# Patient Record
Sex: Male | Born: 1975 | Hispanic: Yes | Marital: Single | Smoking: Former smoker
Health system: Southern US, Community
[De-identification: ages and names within clinical notes are randomized; demographics above are authoritative.]

## PROBLEM LIST (undated history)

## (undated) DIAGNOSIS — E119 Type 2 diabetes mellitus without complications: Secondary | ICD-10-CM

## (undated) DIAGNOSIS — I10 Essential (primary) hypertension: Secondary | ICD-10-CM

## (undated) DIAGNOSIS — Z91199 Patient's noncompliance with other medical treatment and regimen due to unspecified reason: Secondary | ICD-10-CM

## (undated) DIAGNOSIS — R079 Chest pain, unspecified: Secondary | ICD-10-CM

---

## 2009-10-27 ENCOUNTER — Emergency Department: Payer: Self-pay | Admitting: Emergency Medicine

## 2012-08-25 ENCOUNTER — Emergency Department: Payer: Self-pay | Admitting: Emergency Medicine

## 2012-08-27 ENCOUNTER — Emergency Department: Payer: Self-pay | Admitting: Emergency Medicine

## 2012-08-29 ENCOUNTER — Emergency Department: Payer: Self-pay | Admitting: Emergency Medicine

## 2012-08-31 ENCOUNTER — Emergency Department: Payer: Self-pay | Admitting: Emergency Medicine

## 2012-10-15 ENCOUNTER — Emergency Department: Payer: Self-pay | Admitting: Emergency Medicine

## 2013-02-24 ENCOUNTER — Emergency Department: Payer: Self-pay | Admitting: Emergency Medicine

## 2020-03-30 ENCOUNTER — Encounter: Payer: Self-pay | Admitting: Emergency Medicine

## 2020-03-30 ENCOUNTER — Emergency Department
Admission: EM | Admit: 2020-03-30 | Discharge: 2020-03-30 | Disposition: A | Discharge status: Home / Self Care | Payer: Self-pay | Attending: Emergency Medicine | Admitting: Emergency Medicine

## 2020-03-30 ENCOUNTER — Emergency Department: Payer: Self-pay

## 2020-03-30 ENCOUNTER — Other Ambulatory Visit: Payer: Self-pay

## 2020-03-30 DIAGNOSIS — R29810 Facial weakness: Secondary | ICD-10-CM | POA: Insufficient documentation

## 2020-03-30 DIAGNOSIS — R200 Anesthesia of skin: Secondary | ICD-10-CM

## 2020-03-30 DIAGNOSIS — E119 Type 2 diabetes mellitus without complications: Secondary | ICD-10-CM | POA: Insufficient documentation

## 2020-03-30 DIAGNOSIS — R202 Paresthesia of skin: Secondary | ICD-10-CM | POA: Insufficient documentation

## 2020-03-30 HISTORY — DX: Type 2 diabetes mellitus without complications: E11.9

## 2020-03-30 LAB — COMPREHENSIVE METABOLIC PANEL
ALT: 11 U/L (ref 0–44)
AST: 18 U/L (ref 15–41)
Albumin: 4.4 g/dL (ref 3.5–5.0)
Alkaline Phosphatase: 118 U/L (ref 38–126)
Anion gap: 11 (ref 5–15)
BUN: 17 mg/dL (ref 6–20)
CO2: 28 mmol/L (ref 22–32)
Calcium: 9.1 mg/dL (ref 8.9–10.3)
Chloride: 97 mmol/L — ABNORMAL LOW (ref 98–111)
Creatinine, Ser: 0.91 mg/dL (ref 0.61–1.24)
GFR calc Af Amer: >60 mL/min (ref >60)
GFR calc non Af Amer: >60 mL/min (ref >60)
Glucose, Bld: 324 mg/dL — ABNORMAL HIGH (ref 70–99)
Potassium: 3.1 mmol/L — ABNORMAL LOW (ref 3.5–5.1)
Sodium: 136 mmol/L (ref 135–145)
Total Bilirubin: 1 mg/dL (ref 0.3–1.2)
Total Protein: 7.7 g/dL (ref 6.5–8.1)

## 2020-03-30 LAB — CBC WITH DIFFERENTIAL/PLATELET
Abs Immature Granulocytes: 0.01 10*3/uL (ref 0.00–0.07)
Basophils Absolute: 0 10*3/uL (ref 0.0–0.1)
Basophils Relative: 1 %
Eosinophils Absolute: 0.1 10*3/uL (ref 0.0–0.5)
Eosinophils Relative: 2 %
HCT: 42.8 % (ref 39.0–52.0)
Hemoglobin: 14.8 g/dL (ref 13.0–17.0)
Immature Granulocytes: 0 %
Lymphocytes Relative: 39 %
Lymphs Abs: 2.5 10*3/uL (ref 0.7–4.0)
MCH: 27.3 pg (ref 26.0–34.0)
MCHC: 34.6 g/dL (ref 30.0–36.0)
MCV: 78.8 fL — ABNORMAL LOW (ref 80.0–100.0)
Monocytes Absolute: 0.5 10*3/uL (ref 0.1–1.0)
Monocytes Relative: 8 %
Neutro Abs: 3.3 10*3/uL (ref 1.7–7.7)
Neutrophils Relative %: 50 %
Platelets: 238 10*3/uL (ref 150–400)
RBC: 5.43 MIL/uL (ref 4.22–5.81)
RDW: 12.6 % (ref 11.5–15.5)
WBC: 6.5 10*3/uL (ref 4.0–10.5)
nRBC: 0 % (ref 0.0–0.2)

## 2020-03-30 LAB — GLUCOSE, CAPILLARY: Glucose-Capillary: 312 mg/dL — ABNORMAL HIGH (ref 70–99)

## 2020-03-30 LAB — TROPONIN I (HIGH SENSITIVITY): Troponin I (High Sensitivity): 10 ng/L (ref <18)

## 2020-03-30 NOTE — ED Triage Notes (Signed)
Patient ambulatory to triage with steady gait, without difficulty or distress noted; pt reports went to bed at midnight; awoke hr PTA with "numbness" to left arm and left side of head; denies pain, denies accomp symptoms, denies hx of same; denies weakness; pt A&Ox3; PERRL, MAEW

## 2020-03-30 NOTE — ED Provider Notes (Signed)
[facility] Emergency Department Provider Note   ____________________________________________   First MD Initiated Contact with Patient 03/30/20 [phone]     (approximate)  I have reviewed the triage vital signs and the nursing notes.   HISTORY  Chief Complaint Numbness    HPI Gary Bowen is a 44 y.o. male with a past medical history of type 2 diabetes who presents for an episode of left upper extremity and left facial numbness that began approximately 3 hours prior to arrival and was associated with some left jaw pain.  Patient states that he awoke this morning and when he went to use the bathroom began feeling this numbness of his left arm as well the facial symptoms.  Patient states that the symptoms resolved over the next 30 minutes by the time he got to the emergency department.  Patient does endorse associated left-sided neck pain.  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  Patient denies any vision changes, tinnitus, weakness, dizziness, chest pain, abdominal pain, or nausea/vomiting         Past Medical History:  Diagnosis Date  . Diabetes mellitus without complication (HCC)     There are no problems to display for this patient.   History reviewed. No pertinent surgical history.  Prior to Admission medications   Not on File    Allergies Patient has no known allergies.  No family history on file.  Social History Social History   Tobacco Use  . Smoking status: Never Smoker  . Smokeless tobacco: Never Used  Vaping Use  . Vaping Use: Never used  Substance Use Topics  . Alcohol use: Not on file  . Drug use: Not on file    Review of Systems Constitutional: No fever/chills Eyes: No visual changes. ENT: No sore throat. Cardiovascular: Denies chest pain. Respiratory: Denies shortness of breath. Gastrointestinal: No abdominal pain.  No nausea, no  vomiting.  No diarrhea. Genitourinary: Negative for dysuria. Musculoskeletal: Negative for acute arthralgias Skin: Negative for rash. Neurological: Negative for headaches, endorses numbness in left upper extremity Psychiatric: Negative for suicidal ideation/homicidal ideation   ____________________________________________   PHYSICAL EXAM:  VITAL SIGNS: ED Triage Vitals  Enc Vitals Group     BP 03/30/20 0435 (!) 185/108     Pulse Rate 03/30/20 0435 (!) 103     Resp 03/30/20 0435 20     Temp 03/30/20 0435 98.4 F (36.9 C)     Temp Source 03/30/20 0435 Oral     SpO2 03/30/20 0435 100 %     Weight 03/30/20 0421 280 lb (127 kg)     Height 03/30/20 0421 5\' 9"  (1.753 m)     Head Circumference --      Peak Flow --      Pain Score 03/30/20 0421 0     Pain Loc --      Pain Edu? --      Excl. in GC? --    Constitutional: Alert and oriented. Well appearing and in no acute distress. Eyes: Conjunctivae are normal. PERRL. Head: Atraumatic. Nose: No congestion/rhinnorhea. Mouth/Throat: Mucous membranes are moist. Neck: No stridor Cardiovascular: Grossly normal heart sounds.  Good peripheral circulation. Respiratory: Normal respiratory effort.  No retractions. Gastrointestinal: Soft and nontender. No distention. Musculoskeletal: No obvious deformities Neurologic:  Normal speech and language. No gross focal neurologic deficits are appreciated. Skin:  Skin is warm and dry. No rash noted. Psychiatric: Mood and  affect are normal. Speech and behavior are normal.  ____________________________________________   LABS (all labs ordered are listed, but only abnormal results are displayed)  Labs Reviewed  CBC WITH DIFFERENTIAL/PLATELET - Abnormal; Notable for the following components:      Result Value   MCV 78.8 (*)    All other components within normal limits  COMPREHENSIVE METABOLIC PANEL - Abnormal; Notable for the following components:   Potassium 3.1 (*)    Chloride 97 (*)     Glucose, Bld 324 (*)    All other components within normal limits  GLUCOSE, CAPILLARY - Abnormal; Notable for the following components:   Glucose-Capillary 312 (*)    All other components within normal limits  CBG MONITORING, ED  TROPONIN I (HIGH SENSITIVITY)   ____________________________________________  EKG  ED ECG REPORT I, Merwyn Katos, the attending physician, personally viewed and interpreted this ECG.  Date: 03/30/2020 EKG Time: 0424 Rate: 103 Rhythm: Tachycardic sinus rhythm QRS Axis: normal Intervals: normal ST/T Wave abnormalities: normal Narrative Interpretation: no evidence of acute ischemia  ____________________________________________  RADIOLOGY  ED MD interpretation: CT without contrast of the head shows no evidence of intracerebral hemorrhage, skull fracture, or obvious masses  Official radiology report(s): CT Head Wo Contrast  Result Date: 03/30/2020 CLINICAL DATA:  Left arm numbness or tingling, paresthesia EXAM: CT HEAD WITHOUT CONTRAST TECHNIQUE: Contiguous axial images were obtained from the base of the skull through the vertex without intravenous contrast. COMPARISON:  None. FINDINGS: Brain: No evidence of acute infarction, hemorrhage, hydrocephalus, or mass lesion/mass effect. A tiny arachnoid cysts could be present at the left vertex, incidental. No evidence of white matter disease. Vascular: No hyperdense vessel or unexpected calcification. Skull: Normal. Negative for fracture or focal lesion. Sinuses/Orbits: No acute finding. IMPRESSION: Negative study, no explanation symptoms. Electronically Signed   By: Marnee Spring M.D.   On: 03/30/2020 05:17    ____________________________________________   PROCEDURES  Procedure(s) performed (including Critical Care):  Procedures   ____________________________________________   INITIAL IMPRESSION / ASSESSMENT AND PLAN / ED COURSE  As part of my medical decision making, I reviewed the following  data within the electronic MEDICAL RECORD NUMBER Nursing notes reviewed and incorporated, Labs reviewed, EKG interpreted, Old chart reviewed, Radiograph reviewed and Notes from prior ED visits reviewed and incorporated      Presents with sensation of tingling to left upper extremity and left face.  Patients symptoms and work-up not consistent with a stroke and therefore they will be discharged from the ED.  Patient has currently been stabilized in the emergency department.  Patient received an head CT that was negative for stroke.  Patients symptoms not typical for other emergent causes such as dissection, infection, DKA, trauma. Patient will be discharged with strict return precautions and follow up with primary MD within 24 hours for further evaluation.       ____________________________________________   FINAL CLINICAL IMPRESSION(S) / ED DIAGNOSES  Final diagnoses:  Paresthesia  Numbness  Left upper extremity numbness  Left facial numbness     ED Discharge Orders    None       Note:  This document was prepared using Dragon voice recognition software and may include unintentional dictation errors.   Merwyn Katos, MD 03/30/20 [phone]

## 2020-03-30 NOTE — Discharge Instructions (Addendum)
Por favor, [place] ibuprofin o tylenol por inflammacion o dolor

## 2020-03-30 NOTE — ED Notes (Signed)
EDP at bedside  

## 2021-02-18 IMAGING — CT CT HEAD W/O CM
3 series · 15 of 47 positions shown, 18 images · non-contrast
Comparison: None.

CLINICAL DATA: Left arm numbness or tingling, paresthesia

EXAM:
CT HEAD WITHOUT CONTRAST
TECHNIQUE: Contiguous axial images were obtained from the base of the skull
through the vertex without intravenous contrast.

[Series 2: head wo · axial · 0.43mm/px · z∈[-115,+10]mm · 9 of 30 slices shown, 12 images]
[im 3/30  brain]
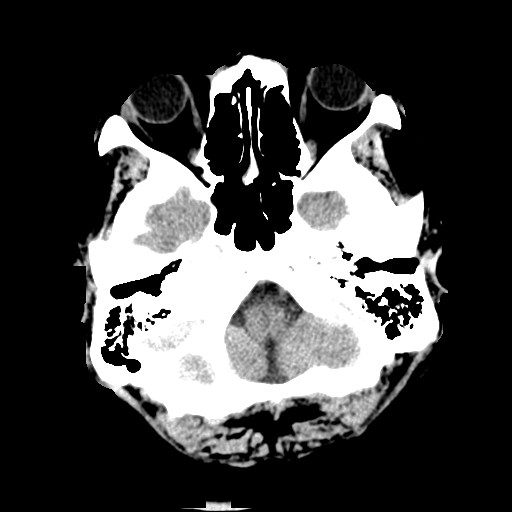
[im 3/30  bone]
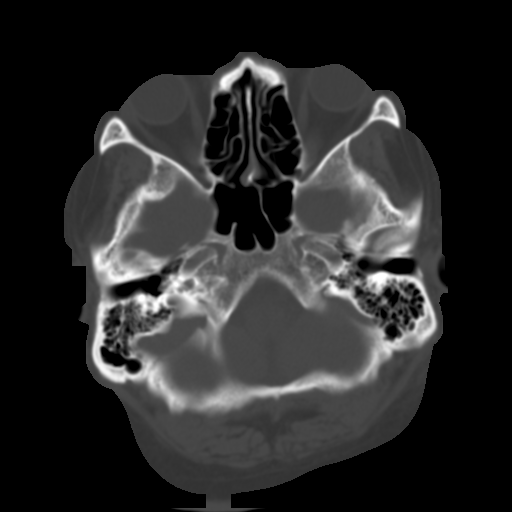
[im 6/30  brain]
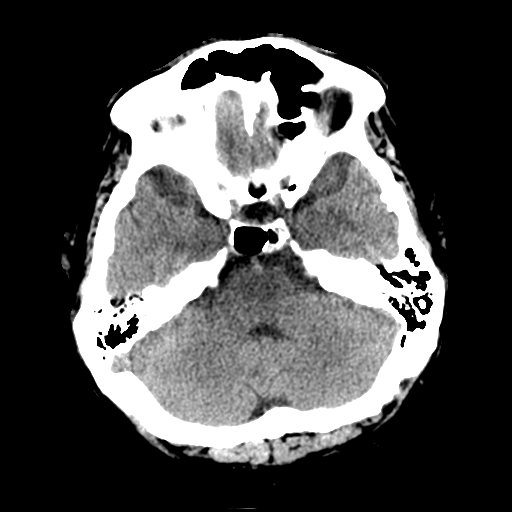
[im 9/30  brain]
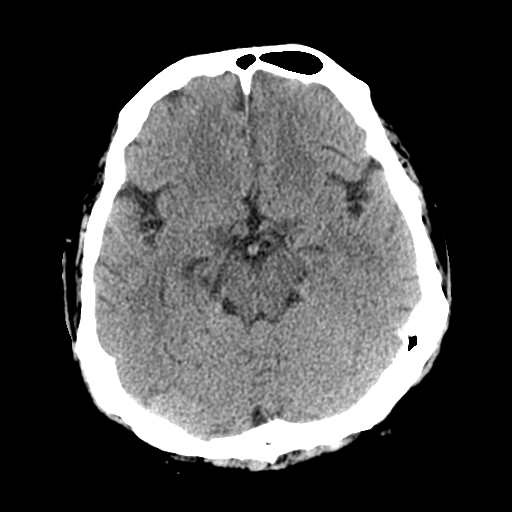
[im 12/30  brain]
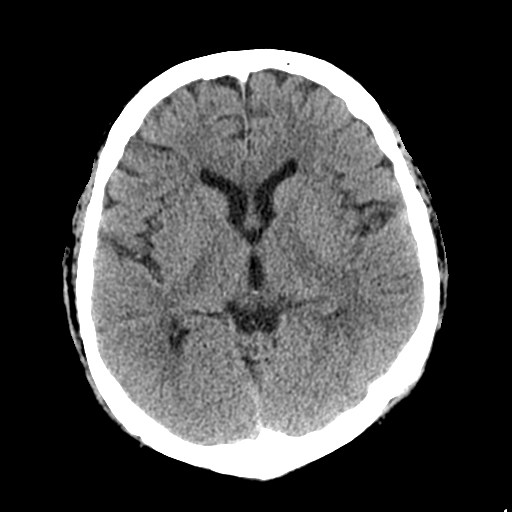
[im 16/30  brain]
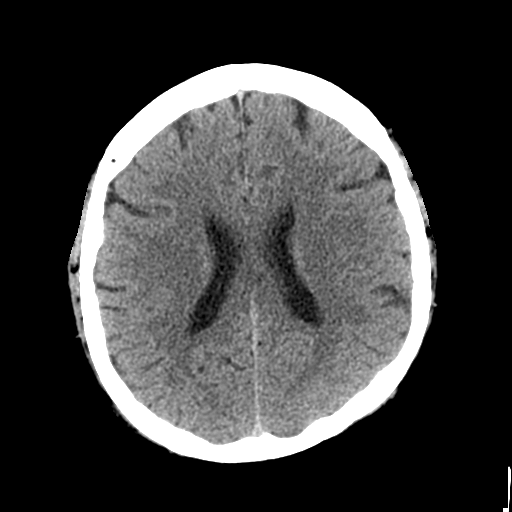
[im 16/30  bone]
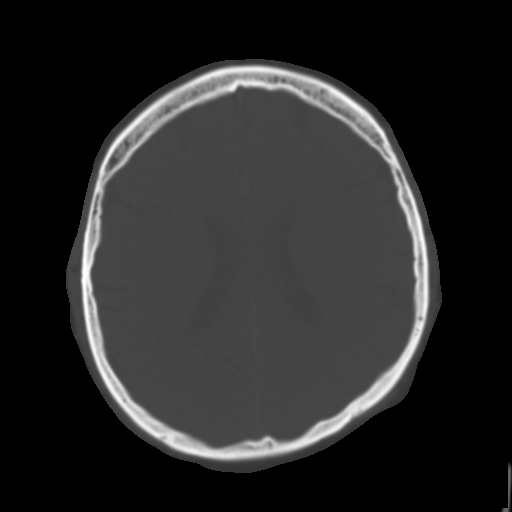
[im 19/30  brain]
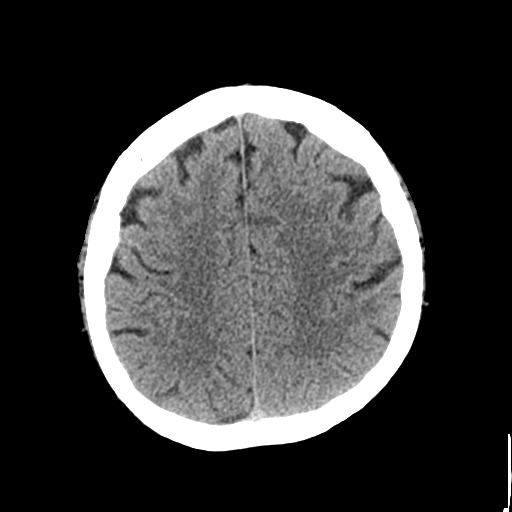
[im 22/30  brain]
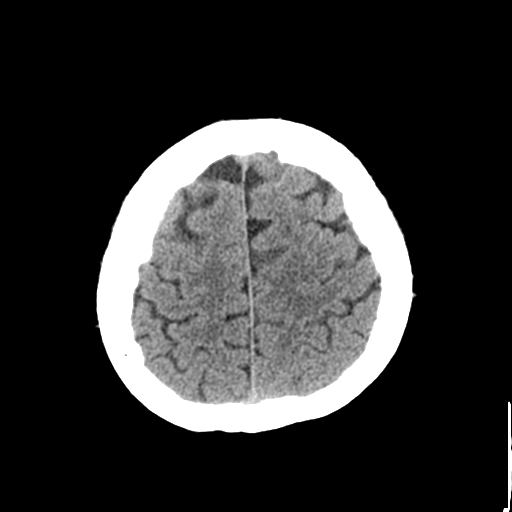
[im 25/30  brain]
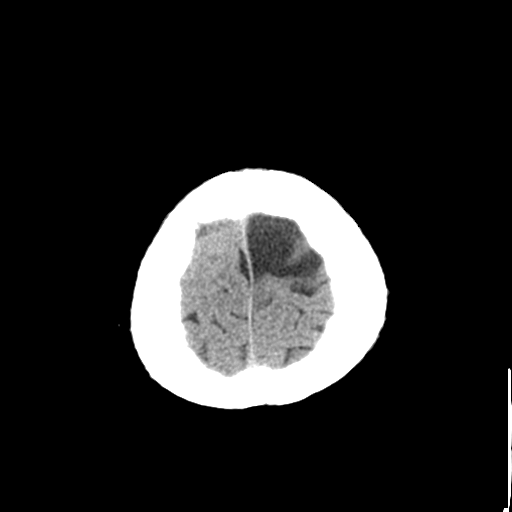
[im 28/30  brain]
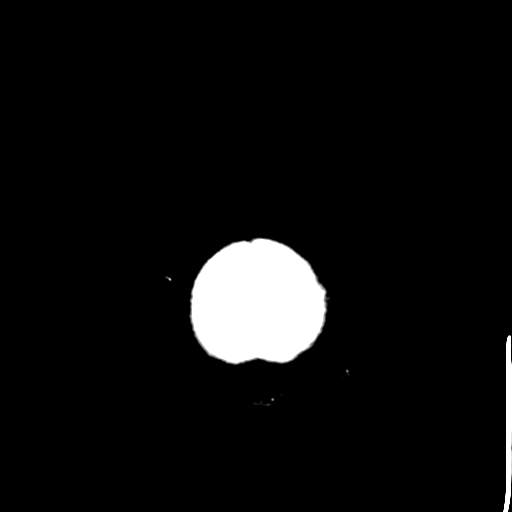
[im 28/30  bone]
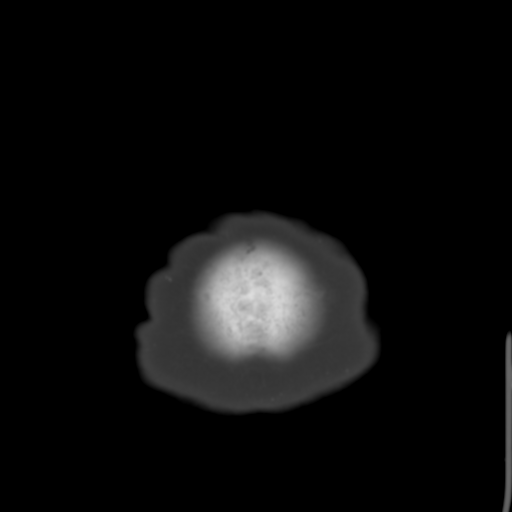

[Series 4: coronal soft tissue · coronal · 0.31mm/px · 3 of 63 slices shown]
[im 21/63  brain]
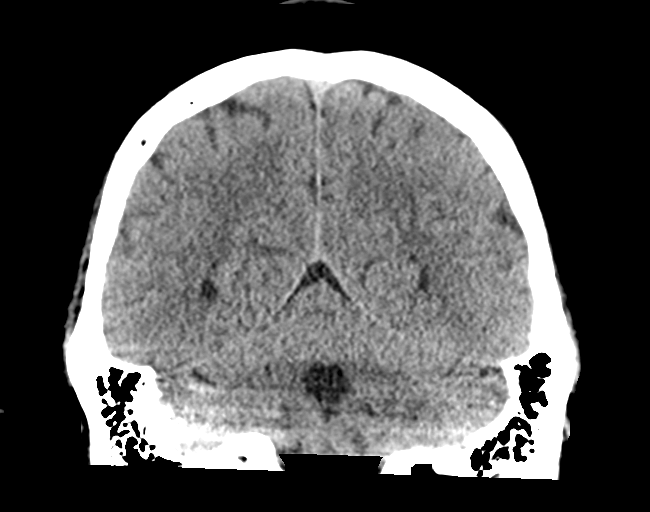
[im 28/63  brain]
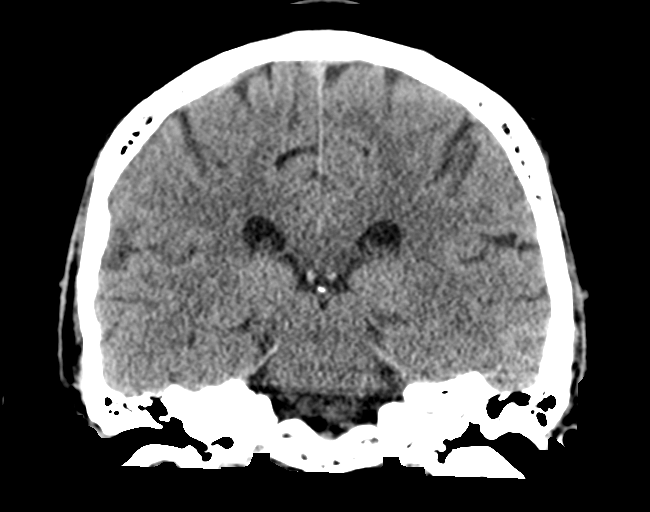
[im 35/63  brain]
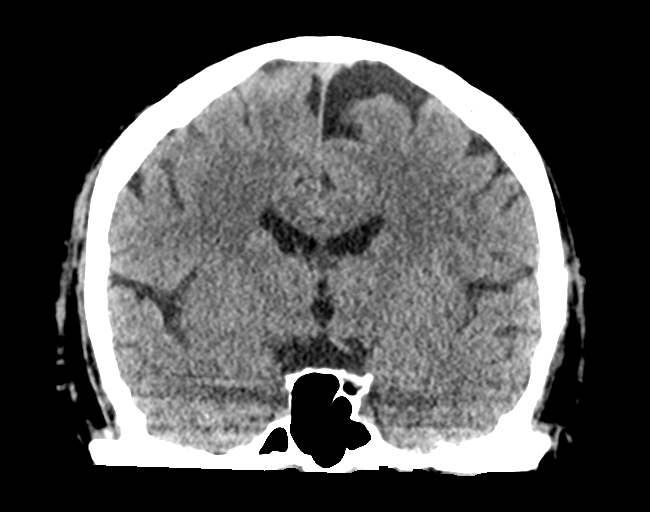

[Series 5: sagittal soft tissue · sagittal · 0.31mm/px · 3 of 60 slices shown]
[im 20/60  brain]
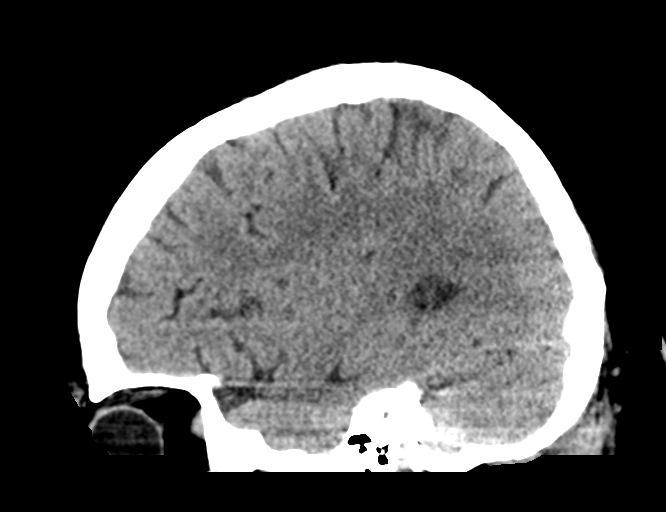
[im 30/60  brain]
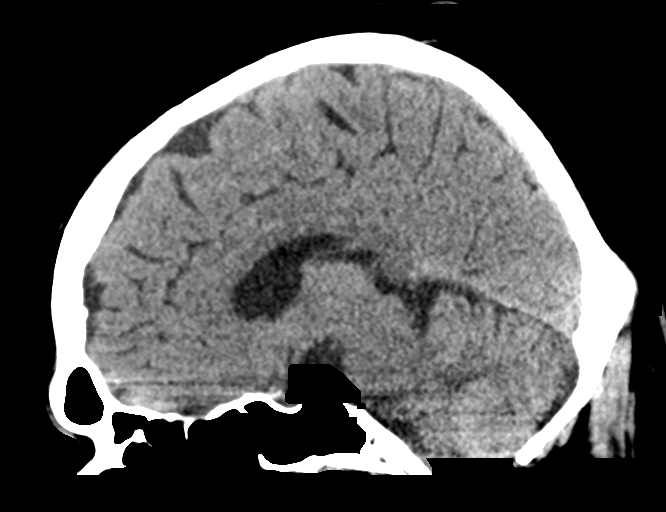
[im 40/60  brain]
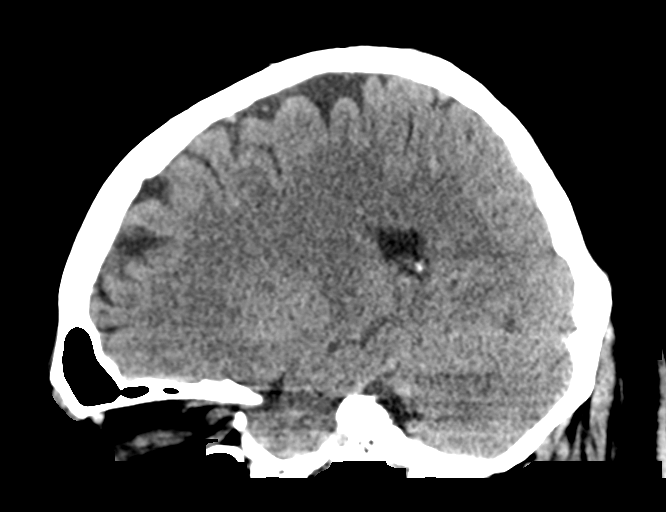

[15 of 47 positions shown; findings below may reference images not displayed]

FINDINGS: Brain: No evidence of acute infarction, hemorrhage, hydrocephalus,
or mass lesion/mass effect. A tiny arachnoid cysts could be present
at the left vertex, incidental. No evidence of white matter disease.

Vascular: No hyperdense vessel or unexpected calcification.

Skull: Normal. Negative for fracture or focal lesion.

Sinuses/Orbits: No acute finding.
IMPRESSION: Negative study, no explanation symptoms.

## 2022-11-20 ENCOUNTER — Other Ambulatory Visit: Payer: Self-pay

## 2022-11-20 ENCOUNTER — Inpatient Hospital Stay (HOSPITAL_COMMUNITY)
Admit: 2022-11-20 | Discharge: 2022-11-20 | Disposition: A | Discharge status: Home / Self Care | Payer: Self-pay | Attending: Nurse Practitioner | Admitting: Nurse Practitioner

## 2022-11-20 ENCOUNTER — Inpatient Hospital Stay
Admission: EM | Admit: 2022-11-20 | Discharge: 2022-11-21 | DRG: 281 | Disposition: A | Discharge status: Other Acute Inpatient Hospital | Payer: Self-pay | Attending: Hospitalist | Admitting: Hospitalist

## 2022-11-20 ENCOUNTER — Emergency Department: Payer: Self-pay

## 2022-11-20 DIAGNOSIS — I10 Essential (primary) hypertension: Secondary | ICD-10-CM | POA: Diagnosis present

## 2022-11-20 DIAGNOSIS — Z87891 Personal history of nicotine dependence: Secondary | ICD-10-CM

## 2022-11-20 DIAGNOSIS — I214 Non-ST elevation (NSTEMI) myocardial infarction: Principal | ICD-10-CM | POA: Diagnosis present

## 2022-11-20 DIAGNOSIS — R079 Chest pain, unspecified: Secondary | ICD-10-CM

## 2022-11-20 DIAGNOSIS — Z79899 Other long term (current) drug therapy: Secondary | ICD-10-CM

## 2022-11-20 DIAGNOSIS — E785 Hyperlipidemia, unspecified: Secondary | ICD-10-CM | POA: Diagnosis present

## 2022-11-20 DIAGNOSIS — I2510 Atherosclerotic heart disease of native coronary artery without angina pectoris: Secondary | ICD-10-CM | POA: Diagnosis present

## 2022-11-20 DIAGNOSIS — I161 Hypertensive emergency: Secondary | ICD-10-CM | POA: Diagnosis present

## 2022-11-20 DIAGNOSIS — E876 Hypokalemia: Secondary | ICD-10-CM | POA: Diagnosis present

## 2022-11-20 DIAGNOSIS — Z8249 Family history of ischemic heart disease and other diseases of the circulatory system: Secondary | ICD-10-CM

## 2022-11-20 DIAGNOSIS — E1165 Type 2 diabetes mellitus with hyperglycemia: Secondary | ICD-10-CM

## 2022-11-20 HISTORY — DX: Morbid (severe) obesity due to excess calories: E66.01

## 2022-11-20 HISTORY — DX: Patient's noncompliance with other medical treatment and regimen due to unspecified reason: Z91.199

## 2022-11-20 HISTORY — DX: Chest pain, unspecified: R07.9

## 2022-11-20 HISTORY — DX: Essential (primary) hypertension: I10

## 2022-11-20 LAB — CBG MONITORING, ED
Glucose-Capillary: 382 mg/dL — ABNORMAL HIGH (ref 70–99)
Glucose-Capillary: 321 mg/dL — ABNORMAL HIGH (ref 70–99)
Glucose-Capillary: 189 mg/dL — ABNORMAL HIGH (ref 70–99)
Glucose-Capillary: 194 mg/dL — ABNORMAL HIGH (ref 70–99)

## 2022-11-20 LAB — BASIC METABOLIC PANEL
Anion gap: 9 (ref 5–15)
BUN: 18 mg/dL (ref 6–20)
CO2: 28 mmol/L (ref 22–32)
Calcium: 9 mg/dL (ref 8.9–10.3)
Chloride: 97 mmol/L — ABNORMAL LOW (ref 98–111)
Creatinine, Ser: 1.06 mg/dL (ref 0.61–1.24)
GFR, Estimated: >60 mL/min (ref >60)
Glucose, Bld: 366 mg/dL — ABNORMAL HIGH (ref 70–99)
Potassium: 3.1 mmol/L — ABNORMAL LOW (ref 3.5–5.1)
Sodium: 134 mmol/L — ABNORMAL LOW (ref 135–145)

## 2022-11-20 LAB — CBC
HCT: 45.7 % (ref 39.0–52.0)
Hemoglobin: 15.6 g/dL (ref 13.0–17.0)
MCH: 27.6 pg (ref 26.0–34.0)
MCHC: 34.1 g/dL (ref 30.0–36.0)
MCV: 80.9 fL (ref 80.0–100.0)
Platelets: 290 10*3/uL (ref 150–400)
RBC: 5.65 MIL/uL (ref 4.22–5.81)
RDW: 12 % (ref 11.5–15.5)
WBC: 8.2 10*3/uL (ref 4.0–10.5)
nRBC: 0 % (ref 0.0–0.2)

## 2022-11-20 LAB — GLUCOSE, CAPILLARY: Glucose-Capillary: 158 mg/dL — ABNORMAL HIGH (ref 70–99)

## 2022-11-20 LAB — ECHOCARDIOGRAM COMPLETE
AR max vel: 3.69 cm2
AV Peak grad: 2.7 mmHg
Ao pk vel: 0.82 m/s
Area-P 1/2: 3.08 cm2
Height: 69 in
S' Lateral: 2.8 cm
Weight: 4480 oz

## 2022-11-20 LAB — HEPARIN LEVEL (UNFRACTIONATED)
Heparin Unfractionated: >1.1 IU/mL — ABNORMAL HIGH (ref 0.30–0.70)
Heparin Unfractionated: 0.12 IU/mL — ABNORMAL LOW (ref 0.30–0.70)

## 2022-11-20 LAB — TROPONIN I (HIGH SENSITIVITY)
Troponin I (High Sensitivity): 208 ng/L (ref <18)
Troponin I (High Sensitivity): 1836 ng/L (ref <18)

## 2022-11-20 LAB — HIV ANTIBODY (ROUTINE TESTING W REFLEX): HIV Screen 4th Generation wRfx: NONREACTIVE

## 2022-11-20 LAB — PROTIME-INR
INR: 1.1 (ref 0.8–1.2)
Prothrombin Time: 14.5 seconds (ref 11.4–15.2)

## 2022-11-20 LAB — APTT: aPTT: 37 seconds — ABNORMAL HIGH (ref 24–36)

## 2022-11-20 MED ORDER — HEPARIN BOLUS VIA INFUSION
3000.0000 [IU] | Freq: Once | INTRAVENOUS | Status: AC
  Administered 2022-11-20: 3000 [IU] via INTRAVENOUS
  Filled 2022-11-20: qty 3000

## 2022-11-20 MED ORDER — CARVEDILOL 3.125 MG PO TABS
3.1250 mg | ORAL_TABLET | Freq: Two times a day (BID) | ORAL | Status: DC
  Administered 2022-11-20 (×2): 3.125 mg via ORAL
  Filled 2022-11-20 (×2): qty 1

## 2022-11-20 MED ORDER — ATORVASTATIN CALCIUM 80 MG PO TABS
80.0000 mg | ORAL_TABLET | Freq: Every day | ORAL | Status: DC
  Administered 2022-11-20 – 2022-11-21 (×2): 80 mg via ORAL
  Filled 2022-11-20: qty 1
  Filled 2022-11-20: qty 4

## 2022-11-20 MED ORDER — SODIUM CHLORIDE 0.9 % WEIGHT BASED INFUSION: 1.0000 mL/kg/h | INTRAVENOUS | Status: DC

## 2022-11-20 MED ORDER — NITROGLYCERIN IN D5W 200-5 MCG/ML-% IV SOLN
0.0000 ug/min | INTRAVENOUS | Status: DC
  Administered 2022-11-20: 5 ug/min via INTRAVENOUS
  Filled 2022-11-20: qty 250

## 2022-11-20 MED ORDER — HEPARIN (PORCINE) 25000 UT/250ML-% IV SOLN
1600.0000 [IU]/h | INTRAVENOUS | Status: DC
  Administered 2022-11-20: 1200 [IU]/h via INTRAVENOUS
  Administered 2022-11-20: 1600 [IU]/h via INTRAVENOUS
  Filled 2022-11-20: qty 250

## 2022-11-20 MED ORDER — METOPROLOL TARTRATE 5 MG/5ML IV SOLN: 5.0000 mg | Freq: Four times a day (QID) | INTRAVENOUS | Status: DC | PRN

## 2022-11-20 MED ORDER — ALPRAZOLAM 0.25 MG PO TABS: 0.2500 mg | ORAL_TABLET | Freq: Two times a day (BID) | ORAL | Status: DC | PRN

## 2022-11-20 MED ORDER — POTASSIUM CHLORIDE CRYS ER 20 MEQ PO TBCR
40.0000 meq | EXTENDED_RELEASE_TABLET | Freq: Once | ORAL | Status: AC
  Administered 2022-11-20: 40 meq via ORAL
  Filled 2022-11-20: qty 2

## 2022-11-20 MED ORDER — ASPIRIN 81 MG PO CHEW
324.0000 mg | CHEWABLE_TABLET | Freq: Once | ORAL | Status: AC
  Administered 2022-11-20: 324 mg via ORAL
  Filled 2022-11-20: qty 4

## 2022-11-20 MED ORDER — SODIUM CHLORIDE 0.9% FLUSH: 3.0000 mL | INTRAVENOUS | Status: DC | PRN

## 2022-11-20 MED ORDER — INSULIN ASPART 100 UNIT/ML IJ SOLN
0.0000 [IU] | INTRAMUSCULAR | Status: DC
  Administered 2022-11-20: 4 [IU] via SUBCUTANEOUS
  Administered 2022-11-20: 20 [IU] via SUBCUTANEOUS
  Administered 2022-11-20: 15 [IU] via SUBCUTANEOUS
  Administered 2022-11-20 (×2): 4 [IU] via SUBCUTANEOUS
  Administered 2022-11-21: 7 [IU] via SUBCUTANEOUS
  Administered 2022-11-21: 4 [IU] via SUBCUTANEOUS
  Filled 2022-11-20 (×7): qty 1

## 2022-11-20 MED ORDER — NITROGLYCERIN 2 % TD OINT: 1.0000 [in_us] | TOPICAL_OINTMENT | Freq: Four times a day (QID) | TRANSDERMAL | Status: DC

## 2022-11-20 MED ORDER — HEPARIN BOLUS VIA INFUSION
4000.0000 [IU] | Freq: Once | INTRAVENOUS | Status: AC
  Administered 2022-11-20: 4000 [IU] via INTRAVENOUS
  Filled 2022-11-20: qty 4000

## 2022-11-20 MED ORDER — ASPIRIN 81 MG PO CHEW
81.0000 mg | CHEWABLE_TABLET | ORAL | Status: AC
  Administered 2022-11-21: 81 mg via ORAL
  Filled 2022-11-20: qty 1

## 2022-11-20 MED ORDER — NITROGLYCERIN 2 % TD OINT
1.0000 [in_us] | TOPICAL_OINTMENT | Freq: Once | TRANSDERMAL | Status: AC
  Administered 2022-11-20: 1 [in_us] via TOPICAL
  Filled 2022-11-20: qty 1

## 2022-11-20 MED ORDER — LOSARTAN POTASSIUM 25 MG PO TABS
25.0000 mg | ORAL_TABLET | Freq: Every day | ORAL | Status: DC
  Administered 2022-11-20 – 2022-11-21 (×2): 25 mg via ORAL
  Filled 2022-11-20 (×2): qty 1

## 2022-11-20 MED ORDER — ACETAMINOPHEN 325 MG PO TABS: 650.0000 mg | ORAL_TABLET | ORAL | Status: DC | PRN

## 2022-11-20 MED ORDER — SODIUM CHLORIDE 0.9% FLUSH
3.0000 mL | Freq: Two times a day (BID) | INTRAVENOUS | Status: DC
  Administered 2022-11-20 (×2): 3 mL via INTRAVENOUS

## 2022-11-20 MED ORDER — ONDANSETRON HCL 4 MG/2ML IJ SOLN: 4.0000 mg | Freq: Four times a day (QID) | INTRAMUSCULAR | Status: DC | PRN

## 2022-11-20 MED ORDER — HEPARIN (PORCINE) 25000 UT/250ML-% IV SOLN
1400.0000 [IU]/h | INTRAVENOUS | Status: DC
  Administered 2022-11-20: 1400 [IU]/h via INTRAVENOUS
  Filled 2022-11-20: qty 250

## 2022-11-20 MED ORDER — ASPIRIN 81 MG PO TBEC: 81.0000 mg | DELAYED_RELEASE_TABLET | Freq: Every day | ORAL | Status: DC

## 2022-11-20 MED ORDER — NITROGLYCERIN 0.4 MG SL SUBL: 0.4000 mg | SUBLINGUAL_TABLET | SUBLINGUAL | Status: DC | PRN

## 2022-11-20 MED ORDER — ATORVASTATIN CALCIUM 20 MG PO TABS: 40.0000 mg | ORAL_TABLET | Freq: Every day | ORAL | Status: DC

## 2022-11-20 MED ORDER — SODIUM CHLORIDE 0.9 % IV SOLN: 250.0000 mL | INTRAVENOUS | Status: DC | PRN

## 2022-11-20 MED ORDER — SODIUM CHLORIDE 0.9 % WEIGHT BASED INFUSION
3.0000 mL/kg/h | INTRAVENOUS | Status: DC
  Administered 2022-11-21: 3 mL/kg/h via INTRAVENOUS

## 2022-11-20 NOTE — Progress Notes (Signed)
  Echocardiogram 2D Echocardiogram has been performed.  Gary Bowen 11/20/2022, 12:09 PM

## 2022-11-20 NOTE — Progress Notes (Signed)
ANTICOAGULATION CONSULT NOTE - Initial Consult  Pharmacy Consult for Heparin  Indication: chest pain/ACS  No Known Allergies  Patient Measurements: Height: 5\' 9"  (175.3 cm) Weight: 127 kg (280 lb) IBW/kg (Calculated) : 70.7 Heparin Dosing Weight: 100 kg  Vital Signs: Temp: 98.1 F (36.7 C) (05/27 0406) BP: 196/115 (05/27 0430) Pulse Rate: 94 (05/27 0430)  Labs: Recent Labs    11/20/22 0409  HGB 15.6  HCT 45.7  PLT 290  CREATININE 1.06  TROPONINIHS 208*    Estimated Creatinine Clearance: 112.3 mL/min (by C-G formula based on SCr of 1.06 mg/dL).   Medical History: Past Medical History:  Diagnosis Date   Diabetes mellitus without complication (HCC)    Hypertension     Medications:  (Not in a hospital admission)   Assessment: Pharmacy consulted to dose heparin in this 47 year old male admitted with ACS/NSTEMI.  No prior anticoag noted. CrCl = 112.3 ml/min   Goal of Therapy:  HL :  0.3 - 0.7 Monitor platelets by anticoagulation protocol: Yes   Plan:  Give 4000 units bolus x 1 Start heparin infusion at 1400 units/hr Check anti-Xa level in 6 hours and daily while on heparin Continue to monitor H&H and platelets  Gary Bowen D 11/20/2022,5:08 AM

## 2022-11-20 NOTE — Assessment & Plan Note (Signed)
Received oral repletion in the ED Continue to monitor 

## 2022-11-20 NOTE — Assessment & Plan Note (Signed)
Patient is on no meds due to not following up Sliding scale coverage for now Follow A1c

## 2022-11-20 NOTE — ED Notes (Signed)
Report given to [facility] and pt moved to c-pod rm 35

## 2022-11-20 NOTE — Progress Notes (Addendum)
Spoke with Dr. Lalla Brothers via secure chat and MD gave clarification order to titrate nitro drip to keep SBP 150 or less.

## 2022-11-20 NOTE — Consult Note (Signed)
 Cardiology Consult    Patient ID: Gary Bowen MRN: [id number], DOB/AGE: 08/25/1975   Admit date: 11/20/2022 Date of Consult: 11/20/2022  Primary Physician: Patient, No Pcp Per Primary Cardiologist: Muhammad Arida, MD Requesting Provider: H. Duncan, MD  Patient Profile    Gary Bowen is a 47 y.o. male with a history of untreated HTN & DM, chest pain, and obesity, who is being seen today for the evaluation of acute coronary syndrome at the request of Dr. Duncan.  Past Medical History   Past Medical History:  Diagnosis Date   Chest pain    a. Long h/o intermittent c/p - reports prev nl w/u in [place], [place].   Diabetes mellitus without complication (HCC)    a. Dx ~ 2014 - doesn't take meds.  Random glucoses "always over 300."   Hypertension    Morbid obesity (HCC)    Noncompliance     History reviewed. No pertinent surgical history.   Allergies  No Known Allergies  History of Present Illness    47 y/o ? w/ a h/o untreated HTN & DM, intermittent chest pain, and obesity.  Patient says he was diagnosed with diabetes and hypertension about 10 years ago.  He was briefly on medications but felt that it was making him feel poorly and therefore he has not been on medicines in many years.  He reports a 7 to 8-year history of intermittent chest discomfort that occurs with either rest or with exertion, a few times a week, described as a mild left-sided discomfort without associated symptoms, lasting up to about 20 minutes, and resolving spontaneously.  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  He is unaware of any provoking or alleviating factors of this chronic pain.  He notes that he was evaluated in [place], [place] several years ago related to this pain with some type of a cardiology scan, possibly an echo, and was told that everything  was okay.  Gary Bowen was in his usual state of health until approximately 2 AM this morning, when he awoke suddenly with more severe retrosternal chest heaviness associated with dyspnea, and radiating to his left elbow.  He had never had such severe symptoms or radiation of his discomfort before.  After about 2 hours of ongoing symptoms, he drove himself to the emergency department.  Here, initial ECG notable for 2 to 3 mm ST segment elevation in 1 and aVL with ST depression in 2, 3, aVF, V5, and V6.  Blood pressure markedly elevated on arrival at 205/131.  He was placed on heparin and nitroglycerin paste, and also given aspirin.  Symptoms began to improve with a follow-up EKG at 4:58 AM showing subtle, inferolateral ST elevation.  Lab work notable for a potassium of 3.1 and initial troponin of 208 with subsequent troponin of 1836.  Nitropaste was applied around 5 AM.  Currently, patient says he is mostly pain free though occasionally will have a poking sensation in his left chest that is very mild, lasts about a second, and resolves spontaneously.  Inpatient Medications     [START ON 11/21/2022] aspirin EC  81 mg Oral Daily   atorvastatin  40 mg Oral Daily   carvedilol  3.125 mg Oral BID WC   insulin aspart  0-20 Units Subcutaneous Q4H   losartan  25 mg Oral Daily     sodium chloride flush  3 mL Intravenous Q12H  Heparin gtt Ntg gtt  Family History    Family History  Problem Relation Age of Onset   Other Mother        Died of COVID   Heart attack Father        first MI in his 60's   He indicated that his mother is deceased. He indicated that his father is deceased. He indicated that his sister is alive. He indicated that his brother is alive.   Social History    Social History   Socioeconomic History   Marital status: Single    Spouse name: Not on file   Number of children: Not on file   Years of education: Not on file   Highest education level: Not on file  Occupational  History   Not on file  Tobacco Use   Smoking status: Former    Types: Cigarettes   Smokeless tobacco: Never   Tobacco comments:    Occasional cigarette when he was younger.  Never habitual.  Vaping Use   Vaping Use: Never used  Substance and Sexual Activity   Alcohol use: Not Currently    Comment: Previously occasional drink, none in > 1 yr   Drug use: Never   Sexual activity: Not on file  Other Topics Concern   Not on file  Social History Narrative   Lives locally - rents a room.  Unmarried.  No children.  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   Social Determinants of Health   Financial Resource Strain: Not on file  Food Insecurity: No Food Insecurity (11/20/2022)   Hunger Vital Sign    Worried About Running Out of Food in the Last Year: Never true    Ran Out of Food in the Last Year: Never true  Transportation Needs: No Transportation Needs (11/20/2022)   PRAPARE - Transportation    Lack of Transportation (Medical): No    Lack of Transportation (Non-Medical): No  Physical Activity: Not on file  Stress: Not on file  Social Connections: Not on file  Intimate Partner Violence: Not At Risk (11/20/2022)   Humiliation, Afraid, Rape, and Kick questionnaire    Fear of Current or Ex-Partner: No    Emotionally Abused: No    Physically Abused: No    Sexually Abused: No     Review of Systems    General:  No chills, fever, night sweats or weight changes.  Cardiovascular:  +++ chest pain/heaviness associated with dyspnea and left arm pain.  No edema, orthopnea, palpitations, paroxysmal nocturnal dyspnea. Dermatological: No rash, lesions/masses Respiratory: No cough, +++ dyspnea in the setting of chest pain. Urologic: No hematuria, dysuria Abdominal:   No nausea, vomiting, diarrhea, bright red blood per rectum, melena, or hematemesis Neurologic:  +++ Chronic headaches at night.  No visual changes, wkns, changes in mental status. All other systems reviewed and are otherwise negative except as  noted above.  Physical Exam    Blood pressure (!) 193/118, pulse 91, temperature 98.1 F (36.7 C), temperature source Oral, resp. rate 16, height 5' 9" (1.753 m), weight 127 kg, SpO2 96 %.  General: Pleasant, NAD Psych: Normal affect. Neuro: Alert and oriented X 3. Moves all extremities spontaneously. HEENT: Normal  Neck: Supple without bruits or JVD. Lungs:  Resp regular and unlabored, CTA. Heart: RRR no s3, s4, or murmurs. Abdomen: Soft, non-tender, non-distended, BS + x 4.  Extremities: No clubbing, cyanosis or edema. DP/PT2+, Radials 2+ and equal bilaterally.    Labs    Cardiac Enzymes Recent Labs  Lab 11/20/22 0409 11/20/22 0557  TROPONINIHS 208* 1,836*      Lab Results  Component Value Date   WBC 8.2 11/20/2022   HGB 15.6 11/20/2022   HCT 45.7 11/20/2022   MCV 80.9 11/20/2022   PLT 290 11/20/2022    Recent Labs  Lab 11/20/22 0409  NA 134*  K 3.1*  CL 97*  CO2 28  BUN 18  CREATININE 1.06  CALCIUM 9.0  GLUCOSE 366*     Radiology Studies    DG Chest 2 View  Result Date: 11/20/2022 CLINICAL DATA:  Left-sided chest pain that began while sleeping EXAM: CHEST - 2 VIEW COMPARISON:  None Available. FINDINGS: Normal heart size and mediastinal contours. No acute infiltrate or edema. No effusion or pneumothorax. No acute osseous findings. IMPRESSION: Negative chest. Electronically Signed   By: Jonathan  Watts M.D.   On: 11/20/2022 04:31    ECG & Cardiac Imaging    5/27 @ 04:07 am - RSR, 96, 2-3mm ST elevation I and aVL w/ ST dep in II, III, aVF, V5-V6 - personally reviewed. 5/27 @ 04:58 am - RSR, 83, subtle, inferolateral ST elevation (I, aVL, II, V5-6), mild ST depression III/aVF - personally reviewed  Assessment & Plan    1.  Acute coronary syndrome: Patient with a long history of somewhat atypical chest discomfort with reported prior evaluation several years ago, that was normal.  He awoke suddenly at 2 AM with severe substernal chest heaviness associated with  dyspnea and radiating to his left arm.  He presented to the ED after about 2 hours of ongoing symptoms and was markedly hypertensive.  ECG notable for 2 to 3 mm ST segment elevation in 1 and aVL with ST depression in 2, 3, aVF, V5-V6.  He was treated with aspirin, heparin, and eventually nitroglycerin paste.  A follow-up ECG at 4:58 AM showed more subtle T2 his inferolateral ST segment changes.  Initial troponin was elevated at 208 with a follow-up troponin of 1836.  Currently, patient says pain is 0/10 they will have an occasional twinge and poking sensation in his chest, lasting just a second or so, and resolving spontaneously.  He is currently in no distress but remains markedly hypertensive.  Follow-up ECG still with subtle ST segment elevation in 1 and aVL and inferolateral depression similar to the 4:58 AM ECG.  I have ordered an echo.  Continue aspirin and heparin.  I am adding carvedilol, losartan, and IV nitroglycerin.  Advised that if patient has any return or worsening of chest discomfort, that he is to let staff know immediately as we would plan on diagnostic catheterization today.  The patient understands that risks include but are not limited to stroke (1 in 1000), death (1 in 1000), kidney failure [usually temporary] (1 in 500), bleeding (1 in 200), allergic reaction [possibly serious] (1 in 200), and agrees to proceed.  At this time, we will schedule this for tomorrow.  2.  Hypertensive emergency: Blood pressure 205/131 on arrival.  He has received nitroglycerin paste only and pressure still 193/118.  Adding carvedilol, losartan, and IV nitroglycerin.  Patient notes prior diagnosis of hypertension but has not been taking any medicines.  3.  Untreated diabetes mellitus/hyperglycemia: Glucose 366 on arrival.  Patient says he was diagnosed with diabetes approximately 10 years ago and though he was on medicine for short period of time, he felt that this caused him to feel lightheaded and dizzy and    therefore he has not been treated for many years.  When he has had random glucose checks over time, he notes that his sugars have always been over 300.  Sliding scale insulin per medicine team.  Will need significant diabetes education during admission.  A1c pending.  4.  Hypokalemia: Potassium 3.1 on arrival.  This has been supplemented.  Follow-up.  5.  Morbid obesity: Patient will benefit from cardiac rehabilitation following discharge.  If not cost prohibitive, would also have a low threshold to add semaglutide in the setting of above.  Will also benefit from outpatient sleep study.  Risk Assessment/Risk Scores:     TIMI Risk Score for Unstable Angina or Non-ST Elevation MI:   The patient's TIMI risk score is 4, which indicates a 20% risk of all cause mortality, new or recurrent myocardial infarction or need for urgent revascularization in the next 14 days.      Signed, Enrrique Mierzwa, NP 11/20/2022, 8:35 AM  For questions or updates, please contact   Please consult [email or web address] for contact info under Cardiology/STEMI.   

## 2022-11-20 NOTE — Hospital Course (Signed)
10  208  1836 K 3.1, gluc 366.

## 2022-11-20 NOTE — Assessment & Plan Note (Signed)
Chest pain with risk factors for in the setting of hypertensive emergency First troponin 208 Continue heparin infusion Continue aspirin Continue Nitropaste Cardiology consult

## 2022-11-20 NOTE — Assessment & Plan Note (Signed)
Expecting improvement with Nitropaste Metoprolol IV as needed for additional control

## 2022-11-20 NOTE — ED Provider Notes (Signed)
[facility] Provider Note    Event Date/Time   First MD Initiated Contact with Patient 11/20/22 [phone]     (approximate)   History   Chest Pain   HPI Gary Bowen is a 47 y.o. male who presents for evaluation of chest pain.  He said that he has been having intermittent and relatively rare episodes of left-sided chest pain for years, but he has never had it checked out.  However it is never been this sharp or severe or lasted this long.  Usually only happens for a couple minutes at a time.  He said that this morning he was already awake but it got him out of bed at about 2 AM.  He went to the bathroom and then by the time he got back in bed the pain was quite a bit worse.  He said it does not make him short of breath but it feels like a sharp pain and heaviness in the left side of his chest.  He also had a little bit of pain in the left arm but that has gone away.  No weakness in his arms nor legs.  No recent fever.  He was feeling fine until the symptoms began.  No nausea or vomiting.  No sweating.  No lightheadedness or dizziness.     Physical Exam   Triage Vital Signs: ED Triage Vitals  Enc Vitals Group     BP 11/20/22 0406 (!) 205/131     Pulse Rate 11/20/22 0406 89     Resp 11/20/22 0406 18     Temp 11/20/22 0406 98.1 F (36.7 C)     Temp src --      SpO2 11/20/22 0406 98 %     Weight 11/20/22 0407 127 kg (280 lb)     Height 11/20/22 0407 1.753 m (5\' 9" )     Head Circumference --      Peak Flow --      Pain Score 11/20/22 0406 8     Pain Loc --      Pain Edu? --      Excl. in GC? --     Most recent vital signs: Vitals:   11/20/22 0406 11/20/22 0430  BP: (!) 205/131 (!) 196/115  Pulse: 89 94  Resp: 18 16  Temp: 98.1 F (36.7 C)   SpO2: 98% 97%    General: Awake, no distress.  CV:  Good peripheral perfusion.  Regular rate and rhythm.  Normal heart sounds. Resp:  Normal effort. Speaking easily and comfortably, no accessory  muscle usage nor intercostal retractions.  Lungs are clear to auscultation bilaterally. Abd:  No distention.    ED Results / Procedures / Treatments   Labs (all labs ordered are listed, but only abnormal results are displayed) Labs Reviewed  BASIC METABOLIC PANEL - Abnormal; Notable for the following components:      Result Value   Sodium 134 (*)    Potassium 3.1 (*)    Chloride 97 (*)    Glucose, Bld 366 (*)    All other components within normal limits  TROPONIN I (HIGH SENSITIVITY) - Abnormal; Notable for the following components:   Troponin I (High Sensitivity) 208 (*)    All other components within normal limits  CBC  APTT  PROTIME-INR     EKG  ED ECG REPORT I, Loleta Rose, the attending physician, personally viewed and interpreted this ECG.  Date: 11/20/2022 EKG Time: 4:07 AM Rate: 96  Rhythm: normal sinus rhythm QRS Axis: normal Intervals: normal ST/T Wave abnormalities: Non-specific ST segment / T-wave changes, but no clear evidence of acute ischemia. Narrative Interpretation: no definitive evidence of acute ischemia; does not meet STEMI criteria.  ED ECG REPORT I, Loleta Rose, the attending physician, personally viewed and interpreted this ECG.  Date: 11/20/2022 EKG Time: 4:58 AM Rate: 83 Rhythm: normal sinus rhythm QRS Axis: normal Intervals: Prolonged QTc of 512 ms. ST/T Wave abnormalities: Non-specific ST segment / T-wave changes, but no clear evidence of acute ischemia. Narrative Interpretation: no definitive evidence of acute ischemia; does not meet STEMI criteria.   RADIOLOGY I viewed and interpreted the patient's two-view chest x-ray and I see no evidence of acute abnormality such as focal pneumonia or widened mediastinum.  I also read the radiologist's report, which confirmed no acute findings.   PROCEDURES:  Critical Care performed: Yes, see critical care procedure note(s)  .1-3 Lead EKG Interpretation  Performed by: Loleta Rose,  MD Authorized by: Loleta Rose, MD     Interpretation: normal     ECG rate:  87   ECG rate assessment: normal     Rhythm: sinus rhythm     Ectopy: none     Conduction: normal   .Critical Care  Performed by: Loleta Rose, MD Authorized by: Loleta Rose, MD   Critical care provider statement:    Critical care time (minutes):  45   Critical care time was exclusive of:  Separately billable procedures and treating other patients   Critical care was necessary to treat or prevent imminent or life-threatening deterioration of the following conditions: NSTEMI requiring heparin.   Critical care was time spent personally by me on the following activities:  Development of treatment plan with patient or surrogate, evaluation of patient's response to treatment, examination of patient, obtaining history from patient or surrogate, ordering and performing treatments and interventions, ordering and review of laboratory studies, ordering and review of radiographic studies, pulse oximetry, re-evaluation of patient's condition and review of old charts     IMPRESSION / MDM / ASSESSMENT AND PLAN / ED COURSE  I reviewed the triage vital signs and the nursing notes.                              Differential diagnosis includes, but is not limited to, ACS, angina, musculoskeletal pain, AAS, pericarditis/myocarditis, PE.  Patient's presentation is most consistent with acute presentation with potential threat to life or bodily function.  Labs/studies ordered: 2 view chest x-ray, EKG x 2, CBC, BMP, high-sensitivity troponin  Interventions/Medications given:  Medications  heparin ADULT infusion 100 units/mL (25000 units/258mL) (1,400 Units/hr Intravenous New Bag/Given 11/20/22 0511)  aspirin chewable tablet 324 mg (324 mg Oral Given 11/20/22 0501)  nitroGLYCERIN (NITROGLYN) 2 % ointment 1 inch (1 inch Topical Given 11/20/22 0501)  potassium chloride SA (KLOR-CON M) CR tablet 40 mEq (40 mEq Oral Given 11/20/22  0500)  heparin bolus via infusion 4,000 Units (4,000 Units Intravenous Bolus from Bag 11/20/22 0511)    (Note:  hospital course my include additional interventions and/or labs/studies not listed above.)   Patient's initial EKG shows some abnormalities most notable in lead III but nothing specific and certainly does not meet criteria for STEMI.  However his initial high-sensitivity troponin is 208 which is concerning.  I suspect that he has been having episodes of angina but now has developed into NSTEMI.  I repeated the EKG and there  were no significant changes and again does not meet criteria for intervention; no indication for emergent consultation with cardiology at the moment.  I ordered 40 mill equivalents of oral potassium for his hypokalemia at 3.1, otherwise his labs are generally reassuring.  I ordered heparin bolus plus infusion per protocol and I ordered a full dose aspirin.  I also ordered an inch of nitroglycerin paste to see if this will improve his uncontrolled hypertension.  If not he may benefit from a nitroglycerin infusion, but I suspect that he is hypertensive at baseline because he said he knows he has a diagnosis of hypertension and diabetes but he does not take medicine and has not been on any medication for about 2 years.  The patient is on the cardiac monitor to evaluate for evidence of arrhythmia and/or significant heart rate changes.   Clinical Course as of 11/20/22 0517  Mon Nov 20, 2022  0510 Consulted with Dr. Para March with the hospitalist service who will admit the patient. [CF]    Clinical Course User Index [CF] Loleta Rose, MD     FINAL CLINICAL IMPRESSION(S) / ED DIAGNOSES   Final diagnoses:  NSTEMI (non-ST elevated myocardial infarction) (HCC)  Malignant hypertension     Rx / DC Orders   ED Discharge Orders     None        Note:  This document was prepared using Dragon voice recognition software and may include unintentional dictation  errors.   Loleta Rose, MD 11/20/22 [phone]

## 2022-11-20 NOTE — Assessment & Plan Note (Signed)
Complicating factor to overall prognosis and care 

## 2022-11-20 NOTE — ED Triage Notes (Signed)
Arrives pov with sudden left sided chest pains that began ~2am while sleeping. Denies and radiation of pain but has an isolated spot on left forearm that began hurting at same time.   Says pain is generally intermittent for years but never becomes this intense or lasts this long.

## 2022-11-20 NOTE — Progress Notes (Signed)
ANTICOAGULATION CONSULT NOTE -   Pharmacy Consult for Heparin  Indication: chest pain/ACS  No Known Allergies  Patient Measurements: Height: 5\' 9"  (175.3 cm) Weight: 127 kg (280 lb) IBW/kg (Calculated) : 70.7 Heparin Dosing Weight: 100 kg  Vital Signs: Temp: 98.4 F (36.9 C) (05/27 1924) Temp Source: Oral (05/27 1715) BP: 144/99 (05/27 1924) Pulse Rate: 100 (05/27 1924)  Labs: Recent Labs    11/20/22 0409 11/20/22 0510 11/20/22 0557 11/20/22 1207 11/20/22 2002  HGB 15.6  --   --   --   --   HCT 45.7  --   --   --   --   PLT 290  --   --   --   --   APTT  --  37*  --   --   --   LABPROT  --  14.5  --   --   --   INR  --  1.1  --   --   --   HEPARINUNFRC  --   --   --  >1.10* 0.12*  CREATININE 1.06  --   --   --   --   TROPONINIHS 208*  --  1,836*  --   --      Estimated Creatinine Clearance: 112.3 mL/min (by C-G formula based on SCr of 1.06 mg/dL).   Medical History: Past Medical History:  Diagnosis Date   Chest pain    a. Long h/o intermittent c/p - reports prev nl w/u in [place], [place].   Diabetes mellitus without complication (HCC)    a. Dx ~ 2014 - doesn't take meds.  Random glucoses "always over 300."   Hypertension    Morbid obesity (HCC)    Noncompliance     Medications:  No medications prior to admission.    Assessment: Pharmacy consulted to dose heparin in this 47 year old male admitted with ACS/NSTEMI.  No prior anticoag noted. CrCl = 112.3 ml/min   5/27 1207 HL >1.10 Supratherapeutic, hold x1 hr and decrease from 1400 to 1200 u/hr  Goal of Therapy:  HL :  0.3 - 0.7 Monitor platelets by anticoagulation protocol: Yes   Plan: heparin level subtherapeutic - 0.12 Bolus 3,000 units and increase infusion to 1600 units/hr Check anti-Xa level in 6 hours after rate change Continue to monitor H&H and platelets  Gary Bowen 11/20/2022,9:13 PM

## 2022-11-20 NOTE — H&P (View-Only) (Signed)
Cardiology Consult    Patient ID: Gary Bowen MRN: [id number], DOB/AGE: July 31, 1975   Admit date: 11/20/2022 Date of Consult: 11/20/2022  Primary Physician: Patient, No Pcp Per Primary Cardiologist: Lorine Bears, MD Requesting Provider: Doylene Canard, MD  Patient Profile    Gary Bowen is a 47 y.o. male with a history of untreated HTN & DM, chest pain, and obesity, who is being seen today for the evaluation of acute coronary syndrome at the request of Dr. Para March.  Past Medical History   Past Medical History:  Diagnosis Date   Chest pain    a. Long h/o intermittent c/p - reports prev nl w/u in [place], [place].   Diabetes mellitus without complication (HCC)    a. Dx ~ 2014 - doesn't take meds.  Random glucoses "always over 300."   Hypertension    Morbid obesity (HCC)    Noncompliance     History reviewed. No pertinent surgical history.   Allergies  No Known Allergies  History of Present Illness    47 y/o ? w/ a h/o untreated HTN & DM, intermittent chest pain, and obesity.  Patient says he was diagnosed with diabetes and hypertension about 10 years ago.  He was briefly on medications but felt that it was making him feel poorly and therefore he has not been on medicines in many years.  He reports a 7 to 8-year history of intermittent chest discomfort that occurs with either rest or with exertion, a few times a week, described as a mild left-sided discomfort without associated symptoms, lasting up to about 20 minutes, and resolving spontaneously.  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  He is unaware of any provoking or alleviating factors of this chronic pain.  He notes that he was evaluated in [place], [place] several years ago related to this pain with some type of a cardiology scan, possibly an echo, and was told that everything  was okay.  Gary Bowen was in his usual state of health until approximately 2 AM this morning, when he awoke suddenly with more severe retrosternal chest heaviness associated with dyspnea, and radiating to his left elbow.  He had never had such severe symptoms or radiation of his discomfort before.  After about 2 hours of ongoing symptoms, he drove himself to the emergency department.  Here, initial ECG notable for 2 to 3 mm ST segment elevation in 1 and aVL with ST depression in 2, 3, aVF, V5, and V6.  Blood pressure markedly elevated on arrival at 205/131.  He was placed on heparin and nitroglycerin paste, and also given aspirin.  Symptoms began to improve with a follow-up EKG at 4:58 AM showing subtle, inferolateral ST elevation.  Lab work notable for a potassium of 3.1 and initial troponin of 208 with subsequent troponin of 1836.  Nitropaste was applied around 5 AM.  Currently, patient says he is mostly pain free though occasionally will have a poking sensation in his left chest that is very mild, lasts about a second, and resolves spontaneously.  Inpatient Medications     [START ON 11/21/2022] aspirin EC  81 mg Oral Daily   atorvastatin  40 mg Oral Daily   carvedilol  3.125 mg Oral BID WC   insulin aspart  0-20 Units Subcutaneous Q4H   losartan  25 mg Oral Daily  sodium chloride flush  3 mL Intravenous Q12H  Heparin gtt Ntg gtt  Family History    Family History  Problem Relation Age of Onset   Other Mother        Died of COVID   Heart attack Father        first MI in his 68's   He indicated that his mother is deceased. He indicated that his father is deceased. He indicated that his sister is alive. He indicated that his brother is alive.   Social History    Social History   Socioeconomic History   Marital status: Single    Spouse name: Not on file   Number of children: Not on file   Years of education: Not on file   Highest education level: Not on file  Occupational  History   Not on file  Tobacco Use   Smoking status: Former    Types: Cigarettes   Smokeless tobacco: Never   Tobacco comments:    Occasional cigarette when he was younger.  Never habitual.  Vaping Use   Vaping Use: Never used  Substance and Sexual Activity   Alcohol use: Not Currently    Comment: Previously occasional drink, none in > 1 yr   Drug use: Never   Sexual activity: Not on file  Other Topics Concern   Not on file  Social History Narrative   Lives locally - rents a room.  Unmarried.  No children.  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   Social Determinants of Health   Financial Resource Strain: Not on file  Food Insecurity: No Food Insecurity (11/20/2022)   Hunger Vital Sign    Worried About Running Out of Food in the Last Year: Never true    Ran Out of Food in the Last Year: Never true  Transportation Needs: No Transportation Needs (11/20/2022)   PRAPARE - Administrator, Civil Service (Medical): No    Lack of Transportation (Non-Medical): No  Physical Activity: Not on file  Stress: Not on file  Social Connections: Not on file  Intimate Partner Violence: Not At Risk (11/20/2022)   Humiliation, Afraid, Rape, and Kick questionnaire    Fear of Current or Ex-Partner: No    Emotionally Abused: No    Physically Abused: No    Sexually Abused: No     Review of Systems    General:  No chills, fever, night sweats or weight changes.  Cardiovascular:  +++ chest pain/heaviness associated with dyspnea and left arm pain.  No edema, orthopnea, palpitations, paroxysmal nocturnal dyspnea. Dermatological: No rash, lesions/masses Respiratory: No cough, +++ dyspnea in the setting of chest pain. Urologic: No hematuria, dysuria Abdominal:   No nausea, vomiting, diarrhea, bright red blood per rectum, melena, or hematemesis Neurologic:  +++ Chronic headaches at night.  No visual changes, wkns, changes in mental status. All other systems reviewed and are otherwise negative except as  noted above.  Physical Exam    Blood pressure (!) 193/118, pulse 91, temperature 98.1 F (36.7 C), temperature source Oral, resp. rate 16, height 5\' 9"  (1.753 m), weight 127 kg, SpO2 96 %.  General: Pleasant, NAD Psych: Normal affect. Neuro: Alert and oriented X 3. Moves all extremities spontaneously. HEENT: Normal  Neck: Supple without bruits or JVD. Lungs:  Resp regular and unlabored, CTA. Heart: RRR no s3, s4, or murmurs. Abdomen: Soft, non-tender, non-distended, BS + x 4.  Extremities: No clubbing, cyanosis or edema. DP/PT2+, Radials 2+ and equal bilaterally.  Labs    Cardiac Enzymes Recent Labs  Lab 11/20/22 0409 11/20/22 0557  TROPONINIHS 208* 1,836*      Lab Results  Component Value Date   WBC 8.2 11/20/2022   HGB 15.6 11/20/2022   HCT 45.7 11/20/2022   MCV 80.9 11/20/2022   PLT 290 11/20/2022    Recent Labs  Lab 11/20/22 0409  NA 134*  K 3.1*  CL 97*  CO2 28  BUN 18  CREATININE 1.06  CALCIUM 9.0  GLUCOSE 366*     Radiology Studies    DG Chest 2 View  Result Date: 11/20/2022 CLINICAL DATA:  Left-sided chest pain that began while sleeping EXAM: CHEST - 2 VIEW COMPARISON:  None Available. FINDINGS: Normal heart size and mediastinal contours. No acute infiltrate or edema. No effusion or pneumothorax. No acute osseous findings. IMPRESSION: Negative chest. Electronically Signed   By: Tiburcio Pea M.D.   On: 11/20/2022 04:31    ECG & Cardiac Imaging    5/27 @ 04:07 am - RSR, 96, 2-52mm ST elevation I and aVL w/ ST dep in II, III, aVF, V5-V6 - personally reviewed. 5/27 @ 04:58 am - RSR, 83, subtle, inferolateral ST elevation (I, aVL, II, V5-6), mild ST depression III/aVF - personally reviewed  Assessment & Plan    1.  Acute coronary syndrome: Patient with a long history of somewhat atypical chest discomfort with reported prior evaluation several years ago, that was normal.  He awoke suddenly at 2 AM with severe substernal chest heaviness associated with  dyspnea and radiating to his left arm.  He presented to the ED after about 2 hours of ongoing symptoms and was markedly hypertensive.  ECG notable for 2 to 3 mm ST segment elevation in 1 and aVL with ST depression in 2, 3, aVF, V5-V6.  He was treated with aspirin, heparin, and eventually nitroglycerin paste.  A follow-up ECG at 4:58 AM showed more subtle T2 his inferolateral ST segment changes.  Initial troponin was elevated at 208 with a follow-up troponin of 1836.  Currently, patient says pain is 0/10 they will have an occasional twinge and poking sensation in his chest, lasting just a second or so, and resolving spontaneously.  He is currently in no distress but remains markedly hypertensive.  Follow-up ECG still with subtle ST segment elevation in 1 and aVL and inferolateral depression similar to the 4:58 AM ECG.  I have ordered an echo.  Continue aspirin and heparin.  I am adding carvedilol, losartan, and IV nitroglycerin.  Advised that if patient has any return or worsening of chest discomfort, that he is to let staff know immediately as we would plan on diagnostic catheterization today.  The patient understands that risks include but are not limited to stroke (1 in 1000), death (1 in 1000), kidney failure [usually temporary] (1 in 500), bleeding (1 in 200), allergic reaction [possibly serious] (1 in 200), and agrees to proceed.  At this time, we will schedule this for tomorrow.  2.  Hypertensive emergency: Blood pressure 205/131 on arrival.  He has received nitroglycerin paste only and pressure still 193/118.  Adding carvedilol, losartan, and IV nitroglycerin.  Patient notes prior diagnosis of hypertension but has not been taking any medicines.  3.  Untreated diabetes mellitus/hyperglycemia: Glucose 366 on arrival.  Patient says he was diagnosed with diabetes approximately 10 years ago and though he was on medicine for short period of time, he felt that this caused him to feel lightheaded and dizzy and  therefore he has not been treated for many years.  When he has had random glucose checks over time, he notes that his sugars have always been over 300.  Sliding scale insulin per medicine team.  Will need significant diabetes education during admission.  A1c pending.  4.  Hypokalemia: Potassium 3.1 on arrival.  This has been supplemented.  Follow-up.  5.  Morbid obesity: Patient will benefit from cardiac rehabilitation following discharge.  If not cost prohibitive, would also have a low threshold to add semaglutide in the setting of above.  Will also benefit from outpatient sleep study.  Risk Assessment/Risk Scores:     TIMI Risk Score for Unstable Angina or Non-ST Elevation MI:   The patient's TIMI risk score is 4, which indicates a 20% risk of all cause mortality, new or recurrent myocardial infarction or need for urgent revascularization in the next 14 days.      Signed, Nicolasa Ducking, NP 11/20/2022, 8:35 AM  For questions or updates, please contact   Please consult [email or web address] for contact info under Cardiology/STEMI.

## 2022-11-20 NOTE — Progress Notes (Signed)
ANTICOAGULATION CONSULT NOTE -   Pharmacy Consult for Heparin  Indication: chest pain/ACS  No Known Allergies  Patient Measurements: Height: 5\' 9"  (175.3 cm) Weight: 127 kg (280 lb) IBW/kg (Calculated) : 70.7 Heparin Dosing Weight: 100 kg  Vital Signs: Temp: 98.3 F (36.8 C) (05/27 1203) Temp Source: Oral (05/27 1203) BP: 161/139 (05/27 1200) Pulse Rate: 86 (05/27 1200)  Labs: Recent Labs    11/20/22 0409 11/20/22 0510 11/20/22 0557 11/20/22 1207  HGB 15.6  --   --   --   HCT 45.7  --   --   --   PLT 290  --   --   --   APTT  --  37*  --   --   LABPROT  --  14.5  --   --   INR  --  1.1  --   --   HEPARINUNFRC  --   --   --  >1.10*  CREATININE 1.06  --   --   --   TROPONINIHS 208*  --  1,836*  --      Estimated Creatinine Clearance: 112.3 mL/min (by C-G formula based on SCr of 1.06 mg/dL).   Medical History: Past Medical History:  Diagnosis Date   Chest pain    a. Long h/o intermittent c/p - reports prev nl w/u in [place], [place].   Diabetes mellitus without complication (HCC)    a. Dx ~ 2014 - doesn't take meds.  Random glucoses "always over 300."   Hypertension    Morbid obesity (HCC)    Noncompliance     Medications:  (Not in a hospital admission)  Assessment: Pharmacy consulted to dose heparin in this 47 year old male admitted with ACS/NSTEMI.  No prior anticoag noted. CrCl = 112.3 ml/min   5/27 1207 HL >1.10 Supratherapeutic, hold x1 hr and decrease from 1400 to 1200 u/hr  Goal of Therapy:  HL :  0.3 - 0.7 Monitor platelets by anticoagulation protocol: Yes   Plan:  5/27 1207 HL >1.10 Supratherapeutic, Hold heparin drip x 1 hour Resume heparin infusion at 1200 units/hr Check anti-Xa level in 6 hours after rate change Continue to monitor H&H and platelets  Gary Bowen A 11/20/2022,12:27 PM

## 2022-11-20 NOTE — H&P (Addendum)
History and Physical    Patient: Gary Bowen WUJ:[id number] DOB: 1976-06-03 DOA: 11/20/2022 DOS: the patient was seen and examined on 11/20/2022 PCP: Patient, No Pcp Per  Patient coming from: Home  Chief Complaint:  Chief Complaint  Patient presents with   Chest Pain    HPI: Gary Bowen is a 47 y.o. male with medical history significant for DM and HTN, not on any medication, family history of MI in father in his 41s, with several year history of intermittent nonexertional chest pain  who presents to the emergency room for evaluation of chest pain that awoke him from sleep at 2 AM.  Chest pain tonight is precordial and described as a pulsating pain.  He has no associated shortness of breath.  No nausea, vomiting or diaphoresis.  He has had no cough, fever or chills.  He denies lower extremity pain or swelling.  Patient states he once had a testing [place] about 9 years ago to evaluate his heart and he was told that he was okay.   ED course and data review: BP on arrival 205/131 with otherwise normal vitals.  First troponin 208.  Labs otherwise significant for blood glucose of 366 and potassium of 3.1.EKG, personally viewed and interpreted showing NSR at 96 with nonspecific ST-T wave changes.  Chest x-ray was nonacute Patient was given chewable aspirin 324 mg, placed on Nitropaste and started on a heparin infusion. Hospitalist consulted for admission.   Review of Systems: As mentioned in the history of present illness. All other systems reviewed and are negative.  Past Medical History:  Diagnosis Date   Diabetes mellitus without complication (HCC)    Hypertension    History reviewed. No pertinent surgical history. Social History:  reports that he has never smoked. He has never used smokeless tobacco. No history on file for alcohol use and drug use.  No Known Allergies  History reviewed. No pertinent family history.  Prior to Admission medications   Not on File     Physical Exam: Vitals:   11/20/22 0406 11/20/22 0407 11/20/22 0430  BP: (!) 205/131  (!) 196/115  Pulse: 89  94  Resp: 18  16  Temp: 98.1 F (36.7 C)    SpO2: 98%  97%  Weight:  127 kg   Height:  5\' 9"  (1.753 m)    Physical Exam Vitals and nursing note reviewed.  Constitutional:      General: He is not in acute distress. HENT:     Head: Normocephalic and atraumatic.  Cardiovascular:     Rate and Rhythm: Normal rate and regular rhythm.     Heart sounds: Normal heart sounds.  Pulmonary:     Effort: Pulmonary effort is normal.     Breath sounds: Normal breath sounds.  Abdominal:     Palpations: Abdomen is soft.     Tenderness: There is no abdominal tenderness.  Neurological:     Mental Status: Mental status is at baseline.     Labs on Admission: I have personally reviewed following labs and imaging studies  CBC: Recent Labs  Lab 11/20/22 0409  WBC 8.2  HGB 15.6  HCT 45.7  MCV 80.9  PLT 290   Basic Metabolic Panel: Recent Labs  Lab 11/20/22 0409  NA 134*  K 3.1*  CL 97*  CO2 28  GLUCOSE 366*  BUN 18  CREATININE 1.06  CALCIUM 9.0   GFR: Estimated Creatinine Clearance: 112.3 mL/min (by C-G formula based on SCr of 1.06 mg/dL). Liver  Function Tests: No results for input(s): "AST", "ALT", "ALKPHOS", "BILITOT", "PROT", "ALBUMIN" in the last 168 hours. No results for input(s): "LIPASE", "AMYLASE" in the last 168 hours. No results for input(s): "AMMONIA" in the last 168 hours. Coagulation Profile: No results for input(s): "INR", "PROTIME" in the last 168 hours. Cardiac Enzymes: No results for input(s): "CKTOTAL", "CKMB", "CKMBINDEX", "TROPONINI" in the last 168 hours. BNP (last 3 results) No results for input(s): "PROBNP" in the last 8760 hours. HbA1C: No results for input(s): "HGBA1C" in the last 72 hours. CBG: No results for input(s): "GLUCAP" in the last 168 hours. Lipid Profile: No results for input(s): "CHOL", "HDL", "LDLCALC", "TRIG",  "CHOLHDL", "LDLDIRECT" in the last 72 hours. Thyroid Function Tests: No results for input(s): "TSH", "T4TOTAL", "FREET4", "T3FREE", "THYROIDAB" in the last 72 hours. Anemia Panel: No results for input(s): "VITAMINB12", "FOLATE", "FERRITIN", "TIBC", "IRON", "RETICCTPCT" in the last 72 hours. Urine analysis: No results found for: "COLORURINE", "APPEARANCEUR", "LABSPEC", "PHURINE", "GLUCOSEU", "HGBUR", "BILIRUBINUR", "KETONESUR", "PROTEINUR", "UROBILINOGEN", "NITRITE", "LEUKOCYTESUR"  Radiological Exams on Admission: DG Chest 2 View  Result Date: 11/20/2022 CLINICAL DATA:  Left-sided chest pain that began while sleeping EXAM: CHEST - 2 VIEW COMPARISON:  None Available. FINDINGS: Normal heart size and mediastinal contours. No acute infiltrate or edema. No effusion or pneumothorax. No acute osseous findings. IMPRESSION: Negative chest. Electronically Signed   By: Tiburcio Pea M.D.   On: 11/20/2022 04:31     Data Reviewed: Relevant notes from primary care and specialist visits, past discharge summaries as available in EHR, including Care Everywhere. Prior diagnostic testing as pertinent to current admission diagnoses Updated medications and problem lists for reconciliation ED course, including vitals, labs, imaging, treatment and response to treatment Triage notes, nursing and pharmacy notes and ED provider's notes Notable results as noted in HPI   Assessment and Plan: * NSTEMI (non-ST elevated myocardial infarction) (HCC) Chest pain with risk factors for in the setting of hypertensive emergency First troponin 208 Continue heparin infusion Continue aspirin Continue Nitropaste Cardiology consult  Hypertensive emergency Expecting improvement with Nitropaste Metoprolol IV as needed for additional control  Uncontrolled type 2 diabetes mellitus with hyperglycemia, without long-term current use of insulin (HCC) Patient is on no meds due to not following up Sliding scale coverage for  now Follow A1c  Hypokalemia Received oral repletion in the ED Continue to monitor  Obesity, Class III, BMI 40-49.9 (morbid obesity) (HCC) Complicating factor to overall prognosis and care     DVT prophylaxis: Heparin infusion  Consults: [facility] cardiology  Advance Care Planning: Full code  Family Communication: none  Disposition Plan: Back to previous home environment  Severity of Illness: The appropriate patient status for this patient is INPATIENT. Inpatient status is judged to be reasonable and necessary in order to provide the required intensity of service to ensure the patient's safety. The patient's presenting symptoms, physical exam findings, and initial radiographic and laboratory data in the context of their chronic comorbidities is felt to place them at high risk for further clinical deterioration. Furthermore, it is not anticipated that the patient will be medically stable for discharge from the hospital within 2 midnights of admission.   * I certify that at the point of admission it is my clinical judgment that the patient will require inpatient hospital care spanning beyond 2 midnights from the point of admission due to high intensity of service, high risk for further deterioration and high frequency of surveillance required.*  Author: Andris Baumann, MD 11/20/2022 5:15 AM  For on  call review [email or web address].

## 2022-11-21 ENCOUNTER — Encounter
Admission: EM | Disposition: A | Discharge status: Other Acute Inpatient Hospital | Payer: Self-pay | Source: Home / Self Care | Attending: Hospitalist

## 2022-11-21 ENCOUNTER — Encounter: Payer: Self-pay | Admitting: Internal Medicine

## 2022-11-21 DIAGNOSIS — Z794 Long term (current) use of insulin: Secondary | ICD-10-CM

## 2022-11-21 DIAGNOSIS — E785 Hyperlipidemia, unspecified: Secondary | ICD-10-CM

## 2022-11-21 DIAGNOSIS — E1165 Type 2 diabetes mellitus with hyperglycemia: Secondary | ICD-10-CM

## 2022-11-21 DIAGNOSIS — I2510 Atherosclerotic heart disease of native coronary artery without angina pectoris: Secondary | ICD-10-CM

## 2022-11-21 DIAGNOSIS — I161 Hypertensive emergency: Secondary | ICD-10-CM

## 2022-11-21 HISTORY — PX: LEFT HEART CATH AND CORONARY ANGIOGRAPHY: CATH118249

## 2022-11-21 LAB — CBC
HCT: 43 % (ref 39.0–52.0)
Hemoglobin: 14.5 g/dL (ref 13.0–17.0)
MCH: 27.4 pg (ref 26.0–34.0)
MCHC: 33.7 g/dL (ref 30.0–36.0)
MCV: 81.1 fL (ref 80.0–100.0)
Platelets: 290 10*3/uL (ref 150–400)
RBC: 5.3 MIL/uL (ref 4.22–5.81)
RDW: 12.5 % (ref 11.5–15.5)
WBC: 11.9 10*3/uL — ABNORMAL HIGH (ref 4.0–10.5)
nRBC: 0 % (ref 0.0–0.2)

## 2022-11-21 LAB — BASIC METABOLIC PANEL
Anion gap: 10 (ref 5–15)
BUN: 27 mg/dL — ABNORMAL HIGH (ref 6–20)
CO2: 27 mmol/L (ref 22–32)
Calcium: 8.9 mg/dL (ref 8.9–10.3)
Chloride: 99 mmol/L (ref 98–111)
Creatinine, Ser: 1.38 mg/dL — ABNORMAL HIGH (ref 0.61–1.24)
GFR, Estimated: >60 mL/min (ref >60)
Glucose, Bld: 197 mg/dL — ABNORMAL HIGH (ref 70–99)
Potassium: 3.5 mmol/L (ref 3.5–5.1)
Sodium: 136 mmol/L (ref 135–145)

## 2022-11-21 LAB — LIPID PANEL
Cholesterol: 222 mg/dL — ABNORMAL HIGH (ref 0–200)
HDL: 45 mg/dL (ref >40)
LDL Cholesterol: 158 mg/dL — ABNORMAL HIGH (ref 0–99)
Total CHOL/HDL Ratio: 4.9 RATIO
Triglycerides: 94 mg/dL (ref <150)
VLDL: 19 mg/dL (ref 0–40)

## 2022-11-21 LAB — HEMOGLOBIN A1C
Hgb A1c MFr Bld: 15.1 % — ABNORMAL HIGH (ref 4.8–5.6)
Mean Plasma Glucose: 387 mg/dL

## 2022-11-21 LAB — GLUCOSE, CAPILLARY
Glucose-Capillary: 147 mg/dL — ABNORMAL HIGH (ref 70–99)
Glucose-Capillary: 161 mg/dL — ABNORMAL HIGH (ref 70–99)
Glucose-Capillary: 187 mg/dL — ABNORMAL HIGH (ref 70–99)
Glucose-Capillary: 201 mg/dL — ABNORMAL HIGH (ref 70–99)

## 2022-11-21 LAB — MAGNESIUM: Magnesium: 2 mg/dL (ref 1.7–2.4)

## 2022-11-21 LAB — HEPARIN LEVEL (UNFRACTIONATED): Heparin Unfractionated: 0.66 IU/mL (ref 0.30–0.70)

## 2022-11-21 SURGERY — LEFT HEART CATH AND CORONARY ANGIOGRAPHY
Anesthesia: Moderate Sedation

## 2022-11-21 MED ORDER — MIDAZOLAM HCL 2 MG/2ML IJ SOLN
INTRAMUSCULAR | Status: DC | PRN
  Administered 2022-11-21: 1 mg via INTRAVENOUS

## 2022-11-21 MED ORDER — HYDRALAZINE HCL 20 MG/ML IJ SOLN: 10.0000 mg | INTRAMUSCULAR | Status: AC | PRN

## 2022-11-21 MED ORDER — CARVEDILOL 6.25 MG PO TABS
6.2500 mg | ORAL_TABLET | Freq: Two times a day (BID) | ORAL | Status: DC
  Filled 2022-11-21: qty 1

## 2022-11-21 MED ORDER — SODIUM CHLORIDE 0.9% FLUSH: 3.0000 mL | INTRAVENOUS | Status: DC | PRN

## 2022-11-21 MED ORDER — IOHEXOL 300 MG/ML  SOLN
INTRAMUSCULAR | Status: DC | PRN
  Administered 2022-11-21: 44 mL

## 2022-11-21 MED ORDER — ATORVASTATIN CALCIUM 80 MG PO TABS: 80.0000 mg | ORAL_TABLET | Freq: Every day | ORAL | Status: AC

## 2022-11-21 MED ORDER — NITROGLYCERIN 0.4 MG SL SUBL: 0.4000 mg | SUBLINGUAL_TABLET | SUBLINGUAL | 12 refills | Status: AC | PRN

## 2022-11-21 MED ORDER — ISOSORBIDE MONONITRATE ER 30 MG PO TB24
30.0000 mg | ORAL_TABLET | Freq: Every day | ORAL | Status: DC
  Administered 2022-11-21: 30 mg via ORAL
  Filled 2022-11-21: qty 1

## 2022-11-21 MED ORDER — FENTANYL CITRATE (PF) 100 MCG/2ML IJ SOLN
INTRAMUSCULAR | Status: AC
  Filled 2022-11-21: qty 2

## 2022-11-21 MED ORDER — MIDAZOLAM HCL 2 MG/2ML IJ SOLN
INTRAMUSCULAR | Status: AC
  Filled 2022-11-21: qty 2

## 2022-11-21 MED ORDER — HEPARIN (PORCINE) IN NACL 2000-0.9 UNIT/L-% IV SOLN
INTRAVENOUS | Status: DC | PRN
  Administered 2022-11-21: 1000 mL

## 2022-11-21 MED ORDER — HEPARIN (PORCINE) IN NACL 1000-0.9 UT/500ML-% IV SOLN
INTRAVENOUS | Status: AC
  Filled 2022-11-21: qty 1000

## 2022-11-21 MED ORDER — ONDANSETRON HCL 4 MG/2ML IJ SOLN: 4.0000 mg | Freq: Four times a day (QID) | INTRAMUSCULAR | Status: DC | PRN

## 2022-11-21 MED ORDER — VERAPAMIL HCL 2.5 MG/ML IV SOLN
INTRAVENOUS | Status: AC
  Filled 2022-11-21: qty 2

## 2022-11-21 MED ORDER — SODIUM CHLORIDE 0.9 % IV SOLN: 250.0000 mL | INTRAVENOUS | Status: DC | PRN

## 2022-11-21 MED ORDER — SODIUM CHLORIDE 0.9% FLUSH: 3.0000 mL | Freq: Two times a day (BID) | INTRAVENOUS | Status: DC

## 2022-11-21 MED ORDER — SODIUM CHLORIDE 0.9 % IV SOLN: INTRAVENOUS | Status: DC

## 2022-11-21 MED ORDER — HEPARIN (PORCINE) 25000 UT/250ML-% IV SOLN: 1600.0000 [IU]/h | INTRAVENOUS | Status: DC

## 2022-11-21 MED ORDER — CARVEDILOL 6.25 MG PO TABS: 6.2500 mg | ORAL_TABLET | Freq: Two times a day (BID) | ORAL | Status: AC

## 2022-11-21 MED ORDER — ISOSORBIDE MONONITRATE ER 30 MG PO TB24: 30.0000 mg | ORAL_TABLET | Freq: Every day | ORAL | Status: AC

## 2022-11-21 MED ORDER — HEPARIN SODIUM (PORCINE) 1000 UNIT/ML IJ SOLN
INTRAMUSCULAR | Status: DC | PRN
  Administered 2022-11-21: 5000 [IU] via INTRAVENOUS

## 2022-11-21 MED ORDER — LOSARTAN POTASSIUM 25 MG PO TABS: 25.0000 mg | ORAL_TABLET | Freq: Every day | ORAL | Status: AC

## 2022-11-21 MED ORDER — HYDRALAZINE HCL 20 MG/ML IJ SOLN: 10.0000 mg | INTRAMUSCULAR | Status: DC | PRN

## 2022-11-21 MED ORDER — HEPARIN SODIUM (PORCINE) 1000 UNIT/ML IJ SOLN
INTRAMUSCULAR | Status: AC
  Filled 2022-11-21: qty 10

## 2022-11-21 MED ORDER — LIDOCAINE HCL (PF) 1 % IJ SOLN
INTRAMUSCULAR | Status: DC | PRN
  Administered 2022-11-21: 2 mL

## 2022-11-21 MED ORDER — FENTANYL CITRATE (PF) 100 MCG/2ML IJ SOLN
INTRAMUSCULAR | Status: DC | PRN
  Administered 2022-11-21: 50 ug via INTRAVENOUS

## 2022-11-21 MED ORDER — VERAPAMIL HCL 2.5 MG/ML IV SOLN
INTRAVENOUS | Status: DC | PRN
  Administered 2022-11-21: 2.5 mg via INTRA_ARTERIAL

## 2022-11-21 SURGICAL SUPPLY — 11 items
CATH 5F 110X4 TIG (CATHETERS) IMPLANT
CATH 5FR JL3.5 JR4 ANG PIG MP (CATHETERS) IMPLANT
DEVICE RAD TR BAND REGULAR (VASCULAR PRODUCTS) IMPLANT
GLIDESHEATH SLEND SS 6F .021 (SHEATH) IMPLANT
GUIDEWIRE INQWIRE 1.5J.035X260 (WIRE) IMPLANT
INQWIRE 1.5J .035X260CM (WIRE) ×1
KIT SYRINGE INJ CVI SPIKEX1 (MISCELLANEOUS) IMPLANT
PACK CARDIAC CATH (CUSTOM PROCEDURE TRAY) ×1 IMPLANT
PROTECTION STATION PRESSURIZED (MISCELLANEOUS) ×1
SET ATX-X65L (MISCELLANEOUS) IMPLANT
STATION PROTECTION PRESSURIZED (MISCELLANEOUS) IMPLANT

## 2022-11-21 NOTE — Interval H&P Note (Signed)
History and Physical Interval Note:  11/21/2022 7:35 AM  Gary Bowen  has presented today for surgery, with the diagnosis of NSTEMI.  The various methods of treatment have been discussed with the patient and family. After consideration of risks, benefits and other options for treatment, the patient has consented to  Procedure(s): LEFT HEART CATH AND CORONARY ANGIOGRAPHY (N/A) as a surgical intervention.  The patient's history has been reviewed, patient examined, no change in status, stable for surgery.  I have reviewed the patient's chart and labs.  Questions were answered to the patient's satisfaction.    Cath Lab Visit (complete for each Cath Lab visit)  Clinical Evaluation Leading to the Procedure:   ACS: Yes.    Non-ACS:  N/A  [name]

## 2022-11-21 NOTE — Progress Notes (Signed)
ANTICOAGULATION CONSULT NOTE -   Pharmacy Consult for Heparin  Indication: chest pain/ACS  No Known Allergies  Patient Measurements: Height: 5\' 9"  (175.3 cm) Weight: 127 kg (280 lb) IBW/kg (Calculated) : 70.7 Heparin Dosing Weight: 100 kg  Vital Signs: Temp: 98.5 F (36.9 C) (05/28 0042) Temp Source: Oral (05/27 1715) BP: 158/90 (05/28 0042) Pulse Rate: 103 (05/28 0042)  Labs: Recent Labs    11/20/22 0409 11/20/22 0510 11/20/22 0557 11/20/22 1207 11/20/22 2002 11/21/22 0259  HGB 15.6  --   --   --   --  14.5  HCT 45.7  --   --   --   --  43.0  PLT 290  --   --   --   --  290  APTT  --  37*  --   --   --   --   LABPROT  --  14.5  --   --   --   --   INR  --  1.1  --   --   --   --   HEPARINUNFRC  --   --   --  >1.10* 0.12* 0.66  CREATININE 1.06  --   --   --   --   --   TROPONINIHS 208*  --  1,836*  --   --   --      Estimated Creatinine Clearance: 112.3 mL/min (by C-G formula based on SCr of 1.06 mg/dL).   Medical History: Past Medical History:  Diagnosis Date   Chest pain    a. Long h/o intermittent c/p - reports prev nl w/u in [place], [place].   Diabetes mellitus without complication (HCC)    a. Dx ~ 2014 - doesn't take meds.  Random glucoses "always over 300."   Hypertension    Morbid obesity (HCC)    Noncompliance     Medications:  No medications prior to admission.    Assessment: Pharmacy consulted to dose heparin in this 47 year old male admitted with ACS/NSTEMI.  No prior anticoag noted. CrCl = 112.3 ml/min   5/27 1207 HL >1.10 Supratherapeutic, hold x1 hr and decrease from 1400 to 1200 u/hr 5/28 0259 HL 0.66, therapeutic x 1  Goal of Therapy:  HL :  0.3 - 0.7 Monitor platelets by anticoagulation protocol: Yes   Plan:  Continue heparin infusion at 1600 units/hr Recheck HL in 6 hours to confirm Continue to monitor H&H and platelets  Otelia Sergeant, PharmD, [facility] 11/21/2022 3:26 AM

## 2022-11-21 NOTE — Progress Notes (Signed)
Rounding Note    Patient Name: Gary Bowen Date of Encounter: 11/21/2022  [facility] Cardiologist: Lorine Bears, MD   Subjective   No further chest pain reported.  Patient denies shortness of breath.  Blood pressure remains elevated with nitroglycerin infusion still running.  Inpatient Medications    Scheduled Meds:  [MAR Hold] atorvastatin  80 mg Oral Daily   carvedilol  6.25 mg Oral BID WC   [MAR Hold] insulin aspart  0-20 Units Subcutaneous Q4H   [MAR Hold] losartan  25 mg Oral Daily   [MAR Hold] sodium chloride flush  3 mL Intravenous Q12H   sodium chloride flush  3 mL Intravenous Q12H   Continuous Infusions:  sodium chloride     sodium chloride     sodium chloride     sodium chloride 1 mL/kg/hr (11/21/22 0501)   [MAR Hold] nitroGLYCERIN 20 mcg/min (11/21/22 0501)   PRN Meds: sodium chloride, sodium chloride, [MAR Hold] acetaminophen, [MAR Hold] ALPRAZolam, hydrALAZINE, iohexol, [MAR Hold] metoprolol tartrate, [MAR Hold] nitroGLYCERIN, [MAR Hold] ondansetron (ZOFRAN) IV, ondansetron (ZOFRAN) IV, sodium chloride flush, sodium chloride flush   Vital Signs    Vitals:   11/21/22 0819 11/21/22 0824 11/21/22 0838 11/21/22 0845  BP: (!) 156/105 (!) 155/102 (!) 150/104 (!) 147/101  Pulse: 88 85 89 85  Resp: 19 11 13 13   Temp:      TempSrc:      SpO2: 98% 100% 94% 95%  Weight:      Height:        Intake/Output Summary (Last 24 hours) at 11/21/2022 0902 Last data filed at 11/20/2022 2115 Gross per 24 hour  Intake 243.24 ml  Output --  Net 243.24 ml      11/20/2022    4:07 AM 03/30/2020    4:21 AM  Last 3 Weights  Weight (lbs) 280 lb 280 lb  Weight (kg) 127.007 kg 127.007 kg      Telemetry    Sinus rhythm - Personally Reviewed  ECG    No new tracing  Physical Exam   GEN: No acute distress.   Neck: No JVD Cardiac: RRR, no murmurs, rubs, or gallops.  Respiratory: Clear to auscultation bilaterally. GI: Soft, nontender,  non-distended  MS: No edema; No deformity. Neuro:  Nonfocal  Psych: Normal affect   Labs    High Sensitivity Troponin:   Recent Labs  Lab 11/20/22 0409 11/20/22 0557  TROPONINIHS 208* 1,836*     Chemistry Recent Labs  Lab 11/20/22 0409 11/21/22 0259  NA 134* 136  K 3.1* 3.5  CL 97* 99  CO2 28 27  GLUCOSE 366* 197*  BUN 18 27*  CREATININE 1.06 1.38*  CALCIUM 9.0 8.9  MG  --  2.0  GFRNONAA >60 >60  ANIONGAP 9 10    Lipids  Recent Labs  Lab 11/21/22 0259  CHOL 222*  TRIG 94  HDL 45  LDLCALC 158*  CHOLHDL 4.9    Hematology Recent Labs  Lab 11/20/22 0409 11/21/22 0259  WBC 8.2 11.9*  RBC 5.65 5.30  HGB 15.6 14.5  HCT 45.7 43.0  MCV 80.9 81.1  MCH 27.6 27.4  MCHC 34.1 33.7  RDW 12.0 12.5  PLT 290 290   Thyroid No results for input(s): "TSH", "FREET4" in the last 168 hours.  BNPNo results for input(s): "BNP", "PROBNP" in the last 168 hours.  DDimer No results for input(s): "DDIMER" in the last 168 hours.   Radiology    CARDIAC CATHETERIZATION  Result Date: 11/21/2022 Conclusions: Significant multivessel coronary artery disease, as detailed below, including 40-50% distal LMCA stenosis, moderate diffuse disease of the LAD with sequential 70-80% distal LAD lesions, small to moderate-caliber D2 branch with 99% ostial stenosis and TIMI-2 flow (most likely culprit for NSTEMI), moderate-caliber ramus intermedius with 90% proximal stenosis, large LCx with 80% proximal stenosis and 80% lesion in proximal portion of dominant OM branch (OM3), and sequential 70% mid and 50% distal RCA stenoses as well as 40% ostial and 70-80% mid rPDA lesions. Upper normal left ventricular filling pressure (LVEDP 15 mmHg). Recommendations: Transfer to tertiary care center with cardiac surgery availability for consideration of CABG.  Patient's three-vessel disease and diabetes mellitus make this the preferable treatment option, though suboptimal distal targets, particularly the mid/distal  LAD, may be a limiting factor. Add isosorbide mononitrate and wean off nitroglycerin infusion, as tolerated. Aggressive secondary prevention of coronary artery disease. Restart heparin infusion 2 hours after TR band removal. Yvonne Kendall, MD [facility]  ECHOCARDIOGRAM COMPLETE  Result Date: 11/20/2022    ECHOCARDIOGRAM REPORT   Patient Name:   [name] Date of Exam: 11/20/2022 Medical Rec #:  [id number]               Height:       69.0 in Accession #:    [id number]              Weight:       280.0 lb Date of Birth:  [birth year]               BSA:          2.384 m Patient Age:    47 years                BP:           147/110 mmHg Patient Gender: M                       HR:           84 bpm. Exam Location:  [facility] Procedure: 2D Echo Indications:     Chest Pain R07.9  History:         Patient has no prior history of Echocardiogram examinations.  Sonographer:     Overton Mam RDCS, FASE Referring Phys:  [id number] BERGE Diagnosing Phys: Lorine Bears MD IMPRESSIONS  1. Left ventricular ejection fraction, by estimation, is 50 to 55%. The left ventricle has low normal function. Left ventricular endocardial border not optimally defined to evaluate regional wall motion. There is moderate left ventricular hypertrophy. Left ventricular diastolic parameters are consistent with Grade I diastolic dysfunction (impaired relaxation).  2. Right ventricular systolic function is normal. The right ventricular size is normal.  3. The mitral valve is normal in structure. No evidence of mitral valve regurgitation. No evidence of mitral stenosis.  4. The aortic valve is normal in structure. Aortic valve regurgitation is not visualized. No aortic stenosis is present.  5. The inferior vena cava is normal in size with greater than 50% respiratory variability, suggesting right atrial pressure of 3 mmHg. FINDINGS  Left Ventricle: Left ventricular ejection fraction, by estimation, is 50 to 55%. The left  ventricle has low normal function. Left ventricular endocardial border not optimally defined to evaluate regional wall motion. The left ventricular internal cavity  size was normal in size. There is moderate left ventricular hypertrophy. Left ventricular diastolic parameters are consistent with Grade I diastolic dysfunction (  impaired relaxation). Right Ventricle: The right ventricular size is normal. No increase in right ventricular wall thickness. Right ventricular systolic function is normal. Left Atrium: Left atrial size was normal in size. Right Atrium: Right atrial size was normal in size. Pericardium: There is no evidence of pericardial effusion. Mitral Valve: The mitral valve is normal in structure. No evidence of mitral valve regurgitation. No evidence of mitral valve stenosis. Tricuspid Valve: The tricuspid valve is normal in structure. Tricuspid valve regurgitation is not demonstrated. No evidence of tricuspid stenosis. Aortic Valve: The aortic valve is normal in structure. Aortic valve regurgitation is not visualized. No aortic stenosis is present. Aortic valve peak gradient measures 2.7 mmHg. Pulmonic Valve: The pulmonic valve was normal in structure. Pulmonic valve regurgitation is not visualized. No evidence of pulmonic stenosis. Aorta: The aortic root is normal in size and structure. Venous: The inferior vena cava is normal in size with greater than 50% respiratory variability, suggesting right atrial pressure of 3 mmHg. IAS/Shunts: No atrial level shunt detected by color flow Doppler.  LEFT VENTRICLE PLAX 2D LVIDd:         4.00 cm   Diastology LVIDs:         2.80 cm   LV e' medial:    4.57 cm/s LV PW:         1.70 cm   LV E/e' medial:  8.7 LV IVS:        1.60 cm   LV e' lateral:   4.57 cm/s LVOT diam:     2.30 cm   LV E/e' lateral: 8.7 LV SV:         45 LV SV Index:   19 LVOT Area:     4.15 cm  RIGHT VENTRICLE RV Basal diam:  2.45 cm RV S prime:     8.92 cm/s TAPSE (M-mode): 1.6 cm LEFT ATRIUM            Index       RIGHT ATRIUM           Index LA diam:      2.90 cm 1.22 cm/m  RA Area:     10.50 cm LA Vol (A2C): 20.8 ml 8.73 ml/m  RA Volume:   19.40 ml  8.14 ml/m LA Vol (A4C): 13.8 ml 5.79 ml/m  AORTIC VALVE                 PULMONIC VALVE AV Area (Vmax): 3.69 cm     PV Vmax:       0.75 m/s AV Vmax:        81.80 cm/s   PV Peak grad:  2.3 mmHg AV Peak Grad:   2.7 mmHg LVOT Vmax:      72.70 cm/s LVOT Vmean:     47.600 cm/s LVOT VTI:       0.108 m  AORTA Ao Root diam: 3.70 cm MITRAL VALVE MV Area (PHT): 3.08 cm    SHUNTS MV Decel Time: 246 msec    Systemic VTI:  0.11 m MV E velocity: 39.80 cm/s  Systemic Diam: 2.30 cm MV A velocity: 52.40 cm/s MV E/A ratio:  0.76 Lorine Bears MD Electronically signed by Lorine Bears MD Signature Date/Time: 11/20/2022/12:54:04 PM    Final    DG Chest 2 View  Result Date: 11/20/2022 CLINICAL DATA:  Left-sided chest pain that began while sleeping EXAM: CHEST - 2 VIEW COMPARISON:  None Available. FINDINGS: Normal heart size and mediastinal contours. No acute infiltrate or edema. No  effusion or pneumothorax. No acute osseous findings. IMPRESSION: Negative chest. Electronically Signed   By: Tiburcio Pea M.D.   On: 11/20/2022 04:31    Cardiac Studies   See catheterization and echocardiogram above.  Patient Profile     47 y.o. male with history of uncontrolled hypertension, hyperlipidemia, and type 2 diabetes mellitus, admitted with NSTEMI.  Assessment & Plan    Non-ST elevation myocardial infarction: Patient is currently chest pain-free.  Catheterization today showed severe multivessel CAD.  Given his diabetes mellitus and three-vessel CAD, PCI was not attempted in favor of cardiac surgery consultation. -Transfer to [facility] initiated (per patient request) for consideration of CABG.  While his three-vessel disease and diabetes mellitus make surgical revascularization preferable, I worry that his distal targets (particularly the LAD) may not be well-suited.   Patient has been accepted by Dr. Revonda Humphrey at [facility]. -Restart heparin infusion 2 hours after TR band removal. -Continue aspirin 81 mg daily.  Defer addition of P2 Y12 inhibitor pending cardiac surgery consultation. -Continue atorvastatin 80 mg daily. -Add isosorbide mononitrate 30 mg daily and increase carvedilol to 6.25 mg twice daily. -Titrate off nitroglycerin infusion as angina and blood pressure allow.  Hypertensive emergency: Patient severely hypertensive on admission with ongoing chest pain and elevated troponin.  Blood pressure improved but still not optimally controlled. -Increase carvedilol and add isosorbide mononitrate. -Continue current dose of losartan.  We will need to monitor renal function given slight bump in creatinine today. -Wean off nitroglycerin infusion.  Hyperlipidemia: LDL moderately elevated (goal less than 70, ideally below 55). -Continue atorvastatin 80 mg daily.  Uncontrolled type 2 diabetes mellitus: Hemoglobin A1c pending. -Ongoing management per internal medicine.  For questions or updates, please contact [facility] Please consult [email or web address] for contact info under [facility].     Signed, Yvonne Kendall, MD  11/21/2022, 9:02 AM

## 2022-11-21 NOTE — Discharge Summary (Signed)
Physician Discharge Summary   Gary Bowen  male DOB: [birth year]  ZOX:[id number]  PCP: Patient, No Pcp Per  Admit date: 11/20/2022 Discharge date: 11/21/2022  Admitted From: home Disposition:  Transfer to [facility] for CABG CODE STATUS: Full code  Discharge Instructions     AMB referral to Phase II Cardiac Rehabilitation   Complete by: As directed    Diagnosis: NSTEMI   After initial evaluation and assessments completed: Virtual Based Care may be provided alone or in conjunction with Phase 2 Cardiac Rehab based on patient barriers.: Yes   Intensive Cardiac Rehabilitation (ICR) [facility] location only OR Traditional Cardiac Rehabilitation (TCR) *If criteria for ICR are not met will enroll in TCR [facility] only): Yes      Hospital Course:  For full details, please see H&P, progress notes, consult notes and ancillary notes.  Briefly,  Gary Bowen is a 47 y.o. male with medical history significant for DM and HTN, not on any medication, with several year history of intermittent nonexertional chest pain who presented to the emergency room for evaluation of chest pain that awoke him from sleep.   * NSTEMI (non-ST elevated myocardial infarction) (HCC) Severe multi-vessel CAD First troponin 208, then trended up to 1836.  Started on heparin gtt and nitro gtt. --heart cath showed severe multi-vessel CAD, so pt is being transferred to [facility] for consideration of CABG. --heparin gtt to be resumed 2 hours post-TR band deflation, and further continuation per [facility] team. --LDL 158, started on Lipitor 80 mg daily. --also started on coreg, losartan, and Imdur.   Hypertensive emergency --BP up to 200's.  Pt admitted to high BP at home and not taking medications for it. --started on nitro gtt which was tapered off prior to transfer. --also started on coreg, losartan, and Imdur.   type 2 diabetes mellitus with hyperglycemia, without long-term current use of insulin (HCC) Patient  is on no meds due to not following up --A1c is pending at the time of transfer --cont ACHS and SSI for now   Hypokalemia --monitored and repleted PRN   Obesity, Class III, BMI 40-49.9 (morbid obesity) (HCC) Complicating factor to overall prognosis and care   Discharge Diagnoses:  Principal Problem:   NSTEMI (non-ST elevated myocardial infarction) (HCC) Active Problems:   Hypertensive emergency   Uncontrolled type 2 diabetes mellitus with hyperglycemia, without long-term current use of insulin (HCC)   Hypokalemia   Obesity, Class III, BMI 40-49.9 (morbid obesity) (HCC)   30 Day Unplanned Readmission Risk Score    Flowsheet Row ED to Hosp-Admission (Current) from 11/20/2022 in [facility]  30 Day Unplanned Readmission Risk Score (%) 12.04 Filed at 11/21/2022 0801       This score is the patient's risk of an unplanned readmission within 30 days of being discharged (0 -100%). The score is based on dignosis, age, lab data, medications, orders, and past utilization.   Low:  0-14.9   Medium: 15-21.9   High: 22-29.9   Extreme: 30 and above         Discharge Instructions:  Allergies as of 11/21/2022   No Known Allergies      Medication List     TAKE these medications    atorvastatin 80 MG tablet Commonly known as: LIPITOR Take 1 tablet (80 mg total) by mouth daily.   carvedilol 6.25 MG tablet Commonly known as: COREG Take 1 tablet (6.25 mg total) by mouth 2 (two) times daily with a meal.  hydrALAZINE 20 MG/ML injection Commonly known as: APRESOLINE Inject 0.5 mLs (10 mg total) into the vein every 20 (twenty) minutes as needed (systolic >180).   isosorbide mononitrate 30 MG 24 hr tablet Commonly known as: IMDUR Take 1 tablet (30 mg total) by mouth daily.   losartan 25 MG tablet Commonly known as: COZAAR Take 1 tablet (25 mg total) by mouth daily.   nitroGLYCERIN 0.4 MG SL tablet Commonly known as: NITROSTAT Place 1  tablet (0.4 mg total) under the tongue every 5 (five) minutes x 3 doses as needed for chest pain.          No Known Allergies   The results of significant diagnostics from this hospitalization (including imaging, microbiology, ancillary and laboratory) are listed below for reference.   Consultations:   Procedures/Studies: CARDIAC CATHETERIZATION  Result Date: 11/21/2022 Conclusions: Significant multivessel coronary artery disease, as detailed below, including 40-50% distal LMCA stenosis, moderate diffuse disease of the LAD with sequential 70-80% distal LAD lesions, small to moderate-caliber D2 branch with 99% ostial stenosis and TIMI-2 flow (most likely culprit for NSTEMI), moderate-caliber ramus intermedius with 90% proximal stenosis, large LCx with 80% proximal stenosis and 80% lesion in proximal portion of dominant OM branch (OM3), and sequential 70% mid and 50% distal RCA stenoses as well as 40% ostial and 70-80% mid rPDA lesions. Upper normal left ventricular filling pressure (LVEDP 15 mmHg). Recommendations: Transfer to tertiary care center with cardiac surgery availability for consideration of CABG.  Patient's three-vessel disease and diabetes mellitus make this the preferable treatment option, though suboptimal distal targets, particularly the mid/distal LAD, may be a limiting factor. Add isosorbide mononitrate and wean off nitroglycerin infusion, as tolerated. Aggressive secondary prevention of coronary artery disease. Restart heparin infusion 2 hours after TR band removal. Yvonne Kendall, MD [facility]  ECHOCARDIOGRAM COMPLETE  Result Date: 11/20/2022    ECHOCARDIOGRAM REPORT   Patient Name:   Gary Bowen Date of Exam: 11/20/2022 Medical Rec #:  [id number]               Height:       69.0 in Accession #:    [id number]              Weight:       280.0 lb Date of Birth:  May 27, 1976               BSA:          2.384 m Patient Age:    47 years                BP:            147/110 mmHg Patient Gender: M                       HR:           84 bpm. Exam Location:  [facility] Procedure: 2D Echo Indications:     Chest Pain R07.9  History:         Patient has no prior history of Echocardiogram examinations.  Sonographer:     Overton Mam RDCS, FASE Referring Phys:  [id number] Do[address]enport BERGE Diagnosing Phys: Lorine Bears MD IMPRESSIONS  1. Left ventricular ejection fraction, by estimation, is 50 to 55%. The left ventricle has low normal function. Left ventricular endocardial border not optimally defined to evaluate regional wall motion. There is moderate left ventricular hypertrophy. Left ventricular diastolic parameters are consistent with Grade I diastolic dysfunction (  impaired relaxation).  2. Right ventricular systolic function is normal. The right ventricular size is normal.  3. The mitral valve is normal in structure. No evidence of mitral valve regurgitation. No evidence of mitral stenosis.  4. The aortic valve is normal in structure. Aortic valve regurgitation is not visualized. No aortic stenosis is present.  5. The inferior vena cava is normal in size with greater than 50% respiratory variability, suggesting right atrial pressure of 3 mmHg. FINDINGS  Left Ventricle: Left ventricular ejection fraction, by estimation, is 50 to 55%. The left ventricle has low normal function. Left ventricular endocardial border not optimally defined to evaluate regional wall motion. The left ventricular internal cavity  size was normal in size. There is moderate left ventricular hypertrophy. Left ventricular diastolic parameters are consistent with Grade I diastolic dysfunction (impaired relaxation). Right Ventricle: The right ventricular size is normal. No increase in right ventricular wall thickness. Right ventricular systolic function is normal. Left Atrium: Left atrial size was normal in size. Right Atrium: Right atrial size was normal in size. Pericardium: There is no evidence of pericardial  effusion. Mitral Valve: The mitral valve is normal in structure. No evidence of mitral valve regurgitation. No evidence of mitral valve stenosis. Tricuspid Valve: The tricuspid valve is normal in structure. Tricuspid valve regurgitation is not demonstrated. No evidence of tricuspid stenosis. Aortic Valve: The aortic valve is normal in structure. Aortic valve regurgitation is not visualized. No aortic stenosis is present. Aortic valve peak gradient measures 2.7 mmHg. Pulmonic Valve: The pulmonic valve was normal in structure. Pulmonic valve regurgitation is not visualized. No evidence of pulmonic stenosis. Aorta: The aortic root is normal in size and structure. Venous: The inferior vena cava is normal in size with greater than 50% respiratory variability, suggesting right atrial pressure of 3 mmHg. IAS/Shunts: No atrial level shunt detected by color flow Doppler.  LEFT VENTRICLE PLAX 2D LVIDd:         4.00 cm   Diastology LVIDs:         2.80 cm   LV e' medial:    4.57 cm/s LV PW:         1.70 cm   LV E/e' medial:  8.7 LV IVS:        1.60 cm   LV e' lateral:   4.57 cm/s LVOT diam:     2.30 cm   LV E/e' lateral: 8.7 LV SV:         45 LV SV Index:   19 LVOT Area:     4.15 cm  RIGHT VENTRICLE RV Basal diam:  2.45 cm RV S prime:     8.92 cm/s TAPSE (M-mode): 1.6 cm LEFT ATRIUM           Index       RIGHT ATRIUM           Index LA diam:      2.90 cm 1.22 cm/m  RA Area:     10.50 cm LA Vol (A2C): 20.8 ml 8.73 ml/m  RA Volume:   19.40 ml  8.14 ml/m LA Vol (A4C): 13.8 ml 5.79 ml/m  AORTIC VALVE                 PULMONIC VALVE AV Area (Vmax): 3.69 cm     PV Vmax:       0.75 m/s AV Vmax:        81.80 cm/s   PV Peak grad:  2.3 mmHg AV Peak Grad:  2.7 mmHg LVOT Vmax:      72.70 cm/s LVOT Vmean:     47.600 cm/s LVOT VTI:       0.108 m  AORTA Ao Root diam: 3.70 cm MITRAL VALVE MV Area (PHT): 3.08 cm    SHUNTS MV Decel Time: 246 msec    Systemic VTI:  0.11 m MV E velocity: 39.80 cm/s  Systemic Diam: 2.30 cm MV A velocity:  52.40 cm/s MV E/A ratio:  0.76 Lorine Bears MD Electronically signed by Lorine Bears MD Signature Date/Time: 11/20/2022/12:54:04 PM    Final    DG Chest 2 View  Result Date: 11/20/2022 CLINICAL DATA:  Left-sided chest pain that began while sleeping EXAM: CHEST - 2 VIEW COMPARISON:  None Available. FINDINGS: Normal heart size and mediastinal contours. No acute infiltrate or edema. No effusion or pneumothorax. No acute osseous findings. IMPRESSION: Negative chest. Electronically Signed   By: Tiburcio Pea M.D.   On: 11/20/2022 04:31      Labs: BNP (last 3 results) No results for input(s): "BNP" in the last 8760 hours. Basic Metabolic Panel: Recent Labs  Lab 11/20/22 0409 11/21/22 0259  NA 134* 136  K 3.1* 3.5  CL 97* 99  CO2 28 27  GLUCOSE 366* 197*  BUN 18 27*  CREATININE 1.06 1.38*  CALCIUM 9.0 8.9  MG  --  2.0   Liver Function Tests: No results for input(s): "AST", "ALT", "ALKPHOS", "BILITOT", "PROT", "ALBUMIN" in the last 168 hours. No results for input(s): "LIPASE", "AMYLASE" in the last 168 hours. No results for input(s): "AMMONIA" in the last 168 hours. CBC: Recent Labs  Lab 11/20/22 0409 11/21/22 0259  WBC 8.2 11.9*  HGB 15.6 14.5  HCT 45.7 43.0  MCV 80.9 81.1  PLT 290 290   Cardiac Enzymes: No results for input(s): "CKTOTAL", "CKMB", "CKMBINDEX", "TROPONINI" in the last 168 hours. BNP: Invalid input(s): "POCBNP" CBG: Recent Labs  Lab 11/20/22 1922 11/21/22 0043 11/21/22 0359 11/21/22 0702 11/21/22 1031  GLUCAP 158* 187* 201* 161* 147*   D-Dimer No results for input(s): "DDIMER" in the last 72 hours. Hgb A1c No results for input(s): "HGBA1C" in the last 72 hours. Lipid Profile Recent Labs    11/21/22 0259  CHOL 222*  HDL 45  LDLCALC 158*  TRIG 94  CHOLHDL 4.9   Thyroid function studies No results for input(s): "TSH", "T4TOTAL", "T3FREE", "THYROIDAB" in the last 72 hours.  Invalid input(s): "FREET3" Anemia work up No results for  input(s): "VITAMINB12", "FOLATE", "FERRITIN", "TIBC", "IRON", "RETICCTPCT" in the last 72 hours. Urinalysis No results found for: "COLORURINE", "APPEARANCEUR", "LABSPEC", "PHURINE", "GLUCOSEU", "HGBUR", "BILIRUBINUR", "KETONESUR", "PROTEINUR", "UROBILINOGEN", "NITRITE", "LEUKOCYTESUR" Sepsis Labs Recent Labs  Lab 11/20/22 0409 11/21/22 0259  WBC 8.2 11.9*   Microbiology No results found for this or any previous visit (from the past 240 hour(s)).   Total time spend on discharging this patient, including the last patient exam, discussing the hospital stay, instructions for ongoing care as it relates to all pertinent caregivers, as well as preparing the medical discharge records, prescriptions, and/or referrals as applicable, is 45 minutes.    Darlin Priestly, MD  [facility] 11/21/2022, 10:44 AM

## 2022-11-21 NOTE — Progress Notes (Signed)
Patient taken via [facility] to [facility] hospital for further intervention.

## 2022-11-22 LAB — LIPOPROTEIN A (LPA): Lipoprotein (a): 83.4 nmol/L — ABNORMAL HIGH (ref <75.0)
